# Patient Record
Sex: Female | Born: 1970
Health system: Southern US, Community
[De-identification: ages and names within clinical notes are randomized; demographics above are authoritative.]

## PROBLEM LIST (undated history)

## (undated) DIAGNOSIS — I10 Essential (primary) hypertension: Secondary | ICD-10-CM

## (undated) DIAGNOSIS — B029 Zoster without complications: Secondary | ICD-10-CM

## (undated) DIAGNOSIS — F32A Depression, unspecified: Secondary | ICD-10-CM

## (undated) DIAGNOSIS — F419 Anxiety disorder, unspecified: Secondary | ICD-10-CM

## (undated) DIAGNOSIS — F329 Major depressive disorder, single episode, unspecified: Secondary | ICD-10-CM

## (undated) HISTORY — DX: Anxiety disorder, unspecified: F41.9

## (undated) HISTORY — DX: Essential (primary) hypertension: I10

## (undated) HISTORY — DX: Major depressive disorder, single episode, unspecified: F32.9

## (undated) HISTORY — DX: Depression, unspecified: F32.A

---

## 1999-02-10 ENCOUNTER — Other Ambulatory Visit: Admission: RE | Admit: 1999-02-10 | Discharge: 1999-02-10 | Payer: Self-pay | Admitting: Obstetrics

## 1999-03-23 ENCOUNTER — Other Ambulatory Visit: Admission: RE | Admit: 1999-03-23 | Discharge: 1999-03-23 | Payer: Self-pay | Admitting: Obstetrics and Gynecology

## 2000-02-01 HISTORY — PX: TUBAL LIGATION: SHX77

## 2000-03-22 ENCOUNTER — Other Ambulatory Visit: Admission: RE | Admit: 2000-03-22 | Discharge: 2000-03-22 | Payer: Self-pay | Admitting: Obstetrics and Gynecology

## 2000-04-14 ENCOUNTER — Ambulatory Visit (HOSPITAL_COMMUNITY): Admission: AD | Admit: 2000-04-14 | Discharge: 2000-04-14 | Payer: Self-pay | Admitting: Obstetrics and Gynecology

## 2000-10-03 ENCOUNTER — Inpatient Hospital Stay (HOSPITAL_COMMUNITY): Admission: AD | Admit: 2000-10-03 | Discharge: 2000-10-03 | Payer: Self-pay | Admitting: Obstetrics and Gynecology

## 2000-10-05 ENCOUNTER — Inpatient Hospital Stay (HOSPITAL_COMMUNITY): Admission: AD | Admit: 2000-10-05 | Discharge: 2000-10-08 | Payer: Self-pay | Admitting: Obstetrics and Gynecology

## 2000-10-05 ENCOUNTER — Encounter (INDEPENDENT_AMBULATORY_CARE_PROVIDER_SITE_OTHER): Payer: Self-pay | Admitting: Specialist

## 2002-02-03 ENCOUNTER — Encounter: Payer: Self-pay | Admitting: Emergency Medicine

## 2002-02-03 ENCOUNTER — Emergency Department (HOSPITAL_COMMUNITY): Admission: EM | Admit: 2002-02-03 | Discharge: 2002-02-03 | Payer: Self-pay | Admitting: Emergency Medicine

## 2003-07-03 ENCOUNTER — Other Ambulatory Visit: Admission: RE | Admit: 2003-07-03 | Discharge: 2003-07-03 | Payer: Self-pay | Admitting: Family Medicine

## 2006-04-29 LAB — CONVERTED CEMR LAB: Pap Smear: NORMAL

## 2006-06-07 ENCOUNTER — Encounter (INDEPENDENT_AMBULATORY_CARE_PROVIDER_SITE_OTHER): Payer: Self-pay | Admitting: Family Medicine

## 2006-06-07 ENCOUNTER — Encounter: Payer: Self-pay | Admitting: Family Medicine

## 2006-06-07 ENCOUNTER — Ambulatory Visit: Payer: Self-pay | Admitting: Family Medicine

## 2006-06-07 DIAGNOSIS — F419 Anxiety disorder, unspecified: Secondary | ICD-10-CM | POA: Insufficient documentation

## 2006-06-07 DIAGNOSIS — I10 Essential (primary) hypertension: Secondary | ICD-10-CM | POA: Insufficient documentation

## 2006-06-07 LAB — CONVERTED CEMR LAB
BUN: 7 mg/dL (ref 6–23)
CO2: 30 meq/L (ref 19–32)
Calcium: 9.5 mg/dL (ref 8.4–10.5)
Chloride: 107 meq/L (ref 96–112)
Creatinine, Ser: 0.6 mg/dL (ref 0.4–1.2)
GFR calc Af Amer: 145 mL/min
GFR calc non Af Amer: 120 mL/min
Glucose, Bld: 91 mg/dL (ref 70–99)
Potassium: 4.2 meq/L (ref 3.5–5.1)
Sodium: 141 meq/L (ref 135–145)

## 2007-03-19 ENCOUNTER — Telehealth (INDEPENDENT_AMBULATORY_CARE_PROVIDER_SITE_OTHER): Payer: Self-pay | Admitting: *Deleted

## 2007-08-24 ENCOUNTER — Ambulatory Visit: Payer: Self-pay | Admitting: *Deleted

## 2007-08-24 DIAGNOSIS — R799 Abnormal finding of blood chemistry, unspecified: Secondary | ICD-10-CM | POA: Insufficient documentation

## 2007-09-17 ENCOUNTER — Encounter (INDEPENDENT_AMBULATORY_CARE_PROVIDER_SITE_OTHER): Payer: Self-pay | Admitting: *Deleted

## 2007-11-29 ENCOUNTER — Encounter (INDEPENDENT_AMBULATORY_CARE_PROVIDER_SITE_OTHER): Payer: Self-pay | Admitting: *Deleted

## 2007-11-29 ENCOUNTER — Ambulatory Visit: Payer: Self-pay | Admitting: Family Medicine

## 2007-11-29 DIAGNOSIS — J069 Acute upper respiratory infection, unspecified: Secondary | ICD-10-CM | POA: Insufficient documentation

## 2008-03-17 ENCOUNTER — Telehealth (INDEPENDENT_AMBULATORY_CARE_PROVIDER_SITE_OTHER): Payer: Self-pay | Admitting: *Deleted

## 2008-06-18 ENCOUNTER — Ambulatory Visit: Payer: Self-pay | Admitting: Family Medicine

## 2008-06-18 ENCOUNTER — Other Ambulatory Visit: Admission: RE | Admit: 2008-06-18 | Discharge: 2008-06-18 | Payer: Self-pay | Admitting: Family Medicine

## 2008-06-18 ENCOUNTER — Encounter: Payer: Self-pay | Admitting: Family Medicine

## 2008-06-19 ENCOUNTER — Encounter (INDEPENDENT_AMBULATORY_CARE_PROVIDER_SITE_OTHER): Payer: Self-pay | Admitting: *Deleted

## 2008-06-19 ENCOUNTER — Encounter: Payer: Self-pay | Admitting: Family Medicine

## 2008-06-19 LAB — CONVERTED CEMR LAB
ALT: 12 units/L (ref 0–35)
AST: 17 units/L (ref 0–37)
Albumin: 4.4 g/dL (ref 3.5–5.2)
Alkaline Phosphatase: 44 units/L (ref 39–117)
BUN: 11 mg/dL (ref 6–23)
Basophils Absolute: 0.1 10*3/uL (ref 0.0–0.1)
Basophils Relative: 1 % (ref 0.0–3.0)
Bilirubin, Direct: 0.2 mg/dL (ref 0.0–0.3)
CO2: 29 meq/L (ref 19–32)
Calcium: 9.4 mg/dL (ref 8.4–10.5)
Chloride: 108 meq/L (ref 96–112)
Cholesterol: 165 mg/dL (ref 0–200)
Creatinine, Ser: 0.7 mg/dL (ref 0.4–1.2)
Eosinophils Absolute: 0.3 10*3/uL (ref 0.0–0.7)
Eosinophils Relative: 4.7 % (ref 0.0–5.0)
GFR calc non Af Amer: 120.4 mL/min (ref >60)
Glucose, Bld: 87 mg/dL (ref 70–99)
HCT: 38.1 % (ref 36.0–46.0)
HDL: 67.1 mg/dL (ref >39.00)
Hemoglobin: 12.7 g/dL (ref 12.0–15.0)
LDL Cholesterol: 91 mg/dL (ref 0–99)
Lymphocytes Relative: 26.4 % (ref 12.0–46.0)
Lymphs Abs: 1.5 10*3/uL (ref 0.7–4.0)
MCHC: 33.4 g/dL (ref 30.0–36.0)
MCV: 82.2 fL (ref 78.0–100.0)
Monocytes Absolute: 0.5 10*3/uL (ref 0.1–1.0)
Monocytes Relative: 8.3 % (ref 3.0–12.0)
Neutro Abs: 3.2 10*3/uL (ref 1.4–7.7)
Neutrophils Relative %: 59.6 % (ref 43.0–77.0)
Platelets: 276 10*3/uL (ref 150.0–400.0)
Potassium: 3.9 meq/L (ref 3.5–5.1)
RBC: 4.63 M/uL (ref 3.87–5.11)
RDW: 13.6 % (ref 11.5–14.6)
Sodium: 141 meq/L (ref 135–145)
TSH: 1.44 microintl units/mL (ref 0.35–5.50)
Total Bilirubin: 1 mg/dL (ref 0.3–1.2)
Total CHOL/HDL Ratio: 2
Total Protein: 7.7 g/dL (ref 6.0–8.3)
Triglycerides: 34 mg/dL (ref 0.0–149.0)
VLDL: 6.8 mg/dL (ref 0.0–40.0)
WBC: 5.6 10*3/uL (ref 4.5–10.5)

## 2008-06-20 ENCOUNTER — Encounter (INDEPENDENT_AMBULATORY_CARE_PROVIDER_SITE_OTHER): Payer: Self-pay | Admitting: *Deleted

## 2008-06-20 LAB — CONVERTED CEMR LAB: Anti Nuclear Antibody(ANA): NEGATIVE

## 2008-09-14 ENCOUNTER — Emergency Department (HOSPITAL_COMMUNITY): Admission: EM | Admit: 2008-09-14 | Discharge: 2008-09-14 | Payer: Self-pay | Admitting: Emergency Medicine

## 2008-09-18 ENCOUNTER — Telehealth (INDEPENDENT_AMBULATORY_CARE_PROVIDER_SITE_OTHER): Payer: Self-pay | Admitting: *Deleted

## 2009-02-24 ENCOUNTER — Telehealth (INDEPENDENT_AMBULATORY_CARE_PROVIDER_SITE_OTHER): Payer: Self-pay | Admitting: *Deleted

## 2009-06-22 ENCOUNTER — Telehealth (INDEPENDENT_AMBULATORY_CARE_PROVIDER_SITE_OTHER): Payer: Self-pay | Admitting: *Deleted

## 2009-08-14 ENCOUNTER — Telehealth (INDEPENDENT_AMBULATORY_CARE_PROVIDER_SITE_OTHER): Payer: Self-pay | Admitting: *Deleted

## 2009-09-06 ENCOUNTER — Other Ambulatory Visit: Admission: RE | Admit: 2009-09-06 | Discharge: 2009-09-06 | Payer: Self-pay | Admitting: Family Medicine

## 2009-09-06 LAB — HM PAP SMEAR: HM Pap smear: NEGATIVE

## 2009-09-07 ENCOUNTER — Ambulatory Visit: Payer: Self-pay | Admitting: Family Medicine

## 2009-09-07 DIAGNOSIS — N920 Excessive and frequent menstruation with regular cycle: Secondary | ICD-10-CM | POA: Insufficient documentation

## 2009-09-07 LAB — CONVERTED CEMR LAB
Albumin: 4.1 g/dL (ref 3.5–5.2)
Alkaline Phosphatase: 40 units/L (ref 39–117)
BUN: 15 mg/dL (ref 6–23)
Basophils Absolute: 0.1 10*3/uL (ref 0.0–0.1)
CO2: 30 meq/L (ref 19–32)
Calcium: 9.2 mg/dL (ref 8.4–10.5)
Creatinine, Ser: 0.6 mg/dL (ref 0.4–1.2)
Eosinophils Absolute: 0.2 10*3/uL (ref 0.0–0.7)
Glucose, Bld: 95 mg/dL (ref 70–99)
HDL: 72.6 mg/dL (ref >39.00)
Hemoglobin: 12.4 g/dL (ref 12.0–15.0)
Lymphocytes Relative: 24.7 % (ref 12.0–46.0)
MCHC: 33.4 g/dL (ref 30.0–36.0)
Neutro Abs: 3.1 10*3/uL (ref 1.4–7.7)
Neutrophils Relative %: 59.3 % (ref 43.0–77.0)
RDW: 13.5 % (ref 11.5–14.6)
TSH: 1.67 microintl units/mL (ref 0.35–5.50)
Triglycerides: 56 mg/dL (ref 0.0–149.0)

## 2009-09-08 ENCOUNTER — Telehealth: Payer: Self-pay | Admitting: Family Medicine

## 2009-09-09 LAB — CONVERTED CEMR LAB: Pap Smear: NEGATIVE

## 2009-09-10 ENCOUNTER — Encounter: Admission: RE | Admit: 2009-09-10 | Discharge: 2009-09-10 | Payer: Self-pay | Admitting: Family Medicine

## 2009-09-10 ENCOUNTER — Telehealth: Payer: Self-pay | Admitting: Family Medicine

## 2009-10-07 ENCOUNTER — Telehealth: Payer: Self-pay | Admitting: Family Medicine

## 2009-10-08 ENCOUNTER — Ambulatory Visit: Payer: Self-pay | Admitting: Family Medicine

## 2009-10-19 ENCOUNTER — Telehealth: Payer: Self-pay | Admitting: Family Medicine

## 2010-03-04 NOTE — Assessment & Plan Note (Signed)
Summary: CPX AND FASTING LABS///SPH   Vital Signs:  Patient profile:   40 year old female Height:      64.50 inches Weight:      172 pounds BMI:     29.17 Pulse rate:   84 / minute BP sitting:   112 / 80  (left arm)  Vitals Entered By: Doristine Devoid CMA (September 07, 2009 8:08 AM) CC: CPX AND LABS W/ PAP   History of Present Illness: 40 yo woman here today for CPE.  no concerns today.  seeing bariatric clinic- on Phenteramine  Preventive Screening-Counseling & Management  Alcohol-Tobacco     Alcohol drinks/day: <1     Smoking Status: never  Caffeine-Diet-Exercise     Does Patient Exercise: no      Sexual History:  currently monogamous.        Drug Use:  never.    Current Medications (verified): 1)  Hydrochlorothiazide 25 Mg  Tabs (Hydrochlorothiazide) .... Take One Tablet Daily  Allergies (verified): 1)  ! Sulfa 2)  ! 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    Physical Exam  General:   Well-developed,well-nourished,in no acute distress; alert,appropriate and cooperative throughout examination Head:  Normocephalic and atraumatic without obvious abnormalities. No apparent alopecia or balding.  Eyes:  No corneal or conjunctival inflammation noted. EOMI. Perrla. Funduscopic exam benign, without hemorrhages, exudates or papilledema. Vision grossly normal. Ears:  External ear exam shows no significant lesions or deformities.  Otoscopic examination reveals clear canals, tympanic membranes are intact bilaterally without bulging, retraction, inflammation or discharge. Hearing is grossly normal bilaterally. Nose:  External nasal examination shows no deformity or inflammation. Nasal mucosa are pink and moist without lesions or exudates. Mouth:  Oral mucosa and oropharynx without lesions or exudates.  Teeth in good repair. Neck:  No deformities, masses, or tenderness noted. Breasts:  No mass, nodules, thickening, tenderness, bulging, retraction, inflamation, nipple discharge or skin changes noted.   Lungs:  Normal respiratory effort, chest expands symmetrically. Lungs are clear to auscultation, no crackles or wheezes. Heart:  Normal rate and regular rhythm. S1 and S2 normal without gallop, murmur, click, rub or other extra sounds. Abdomen:  Bowel sounds positive,abdomen soft and non-tender without masses, organomegaly or hernias noted.  ? palpable uterus Genitalia:  Pelvic Exam:        External: normal female genitalia without lesions or masses        Vagina: normal without lesions or masses        Cervix: normal without lesions or masses        Adnexa: normal bimanual exam without masses or fullness        Uterus: feels mildly enlarged  Pap smear: performed Msk:  No deformity or scoliosis noted of thoracic or lumbar spine.   Pulses:  +2 carotid, DP, radial Extremities:  No clubbing, cyanosis, edema, or deformity noted with normal full range of motion of all joints.   Neurologic:   No cranial nerve deficits noted. Station and gait are normal. Plantar reflexes are down-going bilaterally. DTRs are symmetrical throughout. Sensory, motor and coordinative functions appear intact. Skin:  Intact without suspicious lesions or rashes Cervical Nodes:  No lymphadenopathy noted Axillary Nodes:  No palpable lymphadenopathy Psych:  Cognition and judgment appear intact. Alert and cooperative with normal attention span and concentration. No apparent delusions, illusions, hallucinations   Impression & Recommendations:  Problem # 1:  HEALTHY ADULT FEMALE (ICD-V70.0) Assessment Unchanged pt's PE WNL w/ exception of possible fibroids.  check labs.  encouraged regular diet and exercise.  Orders: Venipuncture (81191) TLB-Lipid Panel (80061-LIPID) TLB-BMP (Basic Metabolic Panel-BMET) (80048-METABOL) TLB-CBC Platelet - w/Differential (85025-CBCD) TLB-Hepatic/Liver Function Pnl (80076-HEPATIC) TLB-TSH (Thyroid Stimulating Hormone) (84443-TSH) T-Vitamin D (25-Hydroxy) (47829-56213)  Problem # 2:  SCREENING FOR MALIGNANT NEOPLASM OF THE CERVIX (ICD-V76.2) Assessment: Unchanged pap collected  Problem # 3:  EXCESSIVE OR FREQUENT MENSTRUATION (ICD-626.2) Assessment: New when questioned about possibility of fibroids pt admits to heavy menstrual bleeding on the first 2 days of her cycle.  'lots of clots'.  will get [place] to determine if fibroids present given mild enlargement of uterus.  Pt expresses understanding and is in agreement w/ this plan. Orders: Radiology Referral (Radiology)  Complete Medication List: 1)  Hydrochlorothiazide 25 Mg Tabs (Hydrochlorothiazide) .... Take one tablet daily  Patient Instructions: 1)  Follow up in 1 year or as needed 2)  We'll notify you of your lab results 3)  Someone will call you with your pelvic ultrasound 4)  Try and get back into your regular exercise 5)  Call with any questions or concerns 6)  Enjoy back to school!!!

## 2010-03-04 NOTE — Progress Notes (Signed)
Summary: [facility] -call-a-nurse  Phone Note Outgoing Call   Details for Reason: [facility] Triage Call Report Triage Record Num: [id number] Operator: Craig Guess Patient Name: [facility] Call Date & Time: 10/17/2009 11:02:51AM Patient Phone: [phone] PCP: Eugenio Hoes. Todd Patient Gender: Female PCP Fax : [phone] Patient DOB: 1970-03-17 Practice Name: [facility] Reason for Call: Patient is calling with Flu-like sx and wants to know what's safe to take with Celexa & HCTZ. Caller reports aching and chills beginning yesterday. A little bit of dizziness and nausea today. Caller asking if she can take Tylenol with her other meds. Caller advised Tylenol is safe to take. Advised to rest and increase fluids. She voices understanding. Protocol(s) Used: Medication Question Calls, No Triage (Adults) Recommended Outcome per Protocol: Provide Information or Advice Only Reason for Outcome: Caller has medication question only and triager answers question Care Advice:  ~ 09/ Summary of Call: left message to call office ...............Marland KitchenFelecia Deloach CMA  October 19, 2009 8:28 AM  left message to call office ................Marland KitchenFelecia Deloach CMA  October 19, 2009 4:48 PM  left message to call office ....................Marland KitchenFelecia Deloach CMA  October 20, 2009 8:57 AM    Pt states that she is feeling much better now, advise pt if symptoms return to give [place] a call. Pt ok verbalized understanding ..................Marland KitchenFelecia Deloach CMA  October 21, 2009 10:42 AM   Follow-up for Phone Call        noted Follow-up by: Neena Rhymes MD,  October 21, 2009 11:04 AM

## 2010-03-04 NOTE — Assessment & Plan Note (Signed)
Summary: discuss anxiety//lch   Vital Signs:  Patient profile:   40 year old female Weight:      171 pounds Pulse rate:   106 / minute BP sitting:   124 / 76  (left arm)  Vitals Entered By: Doristine Devoid CMA (October 08, 2009 1:47 PM) CC: discuss anxiety not getting enough sleep and increase stress    History of Present Illness: 40 yo woman here today for anxiety/depression.  not sleeping.  increased stress at home since school started.  problems w/ husband.  crying frequently.  often snapping at the kids.  previously on Zoloft- had diarrhea, low sex drive, fatigue.  pt has set up counseling.  exercising regularly- good stress outlet.  would like to re-start meds.  Current Medications (verified): 1)  Hydrochlorothiazide 25 Mg  Tabs (Hydrochlorothiazide) .... Take One Tablet Daily 2)  Vitamin D (Ergocalciferol) 50000 Unit Caps (Ergocalciferol) .... Take 1 Capsule A Week For 12 Weeks.  Allergies (verified): 1)  ! Sulfa 2)  ! 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 Alert and cooperative with normal attention span and concentration. No apparent delusions, illusions, hallucinations   Impression & Recommendations:  Problem # 1:  DEPRESSION (ICD-311) Assessment Deteriorated Pt having recurrence due to marital stress.  start SSRI.  applauded her decision for counseling.  will follow closely. Her updated medication list for this problem includes:    Citalopram Hydrobromide 20 Mg Tabs (Citalopram hydrobromide) .Marland Kitchen... Take one tablet by mouth daily  Complete Medication  List: 1)  Hydrochlorothiazide 25 Mg Tabs (Hydrochlorothiazide) .... Take one tablet daily 2)  Vitamin D (ergocalciferol) 50000 Unit Caps (Ergocalciferol) .... Take 1 capsule a week for 12 weeks. 3)  Citalopram Hydrobromide 20 Mg Tabs (Citalopram hydrobromide) .... Take one tablet by mouth daily  Patient Instructions: 1)  Please schedule a follow-up appointment in 1 month to follow up depression. 2)  If you have side effects- call me! 3)  I'm proud of you for starting counseling 4)  Take the Citalopram (Celexa) daily- if you have fatigue, take it at night 5)  Hang in there!! Prescriptions: CITALOPRAM HYDROBROMIDE 20 MG TABS (CITALOPRAM HYDROBROMIDE) take one tablet by mouth daily  #30 x 3   Entered and Authorized by:   Neena Rhymes MD   Signed by:   Neena Rhymes MD on 10/08/2009   Method used:   Electronically to        [facility] [phone]* (retail)       [address]       [place], [place]  [address]       Ph: [phone]       Fax: [phone]   RxID:   [id number]

## 2010-03-04 NOTE — Progress Notes (Signed)
Summary: cpx scheduled [id number]  Phone Note Outgoing Call   Summary of Call: DUE CPX WITH DR Cammy Copa [facility]  Jun 22, 2009 9:40 AM   Follow-up for Phone Call        [email or web address] Barban  Jun 22, 2009 9:43 AM  Additional Follow-up for Phone Call Additional follow up Details #1::        patient retd call scheduled cpx [id number] Additional Follow-up by: Okey Regal [place],  Jun 22, 2009 4:03 PM

## 2010-03-04 NOTE — Progress Notes (Signed)
  Phone Note Call from Patient   Caller: Patient Summary of Call: pt c/o achy chest pain dull ache middle, recommend pt to ED today, pt agreed. pt was due for followup from May ov scheduled with NP Initial call taken by: Kandice Hams,  February 24, 2009 12:14 PM

## 2010-03-04 NOTE — Progress Notes (Signed)
Summary: hair loss  Phone Note Call from Patient   Caller: Patient Call For: Neena Rhymes MD Summary of Call: Pt states she has been having hair loss x years (since she had her children) and seems to be getting worse. Wants to know if low vitamin D can be causing this? Pt is also taking Phentermine 30mg  once daily from Bariatric clinic. Please advise. Nicki Guadalajara Fergerson CMA Duncan Dull)  September 10, 2009 10:32 AM   Follow-up for Phone Call        according to multiple articles there may be a link between vit D deficieny and hair loss.  she should take the replacement therapy as directed and see if sxs improve. Follow-up by: Neena Rhymes MD,  September 10, 2009 12:47 PM  Additional Follow-up for Phone Call Additional follow up Details #1::        Pt notified. Nicki Guadalajara Fergerson CMA (AAMA)  September 10, 2009 1:31 PM     New/Updated Medications: PHENTERMINE HCL 30 MG CAPS (PHENTERMINE HCL) Take 1 tablet by mouth once a day

## 2010-03-04 NOTE — Progress Notes (Signed)
Summary: labs results  Phone Note Outgoing Call   Call placed by: Doristine Devoid CMA,  September 08, 2009 4:04 PM Call placed to: Patient Summary of Call: level is low.  needs to start 50,000 units weekly x12 weeks and then recheck level   labs look great!  keep up the good work!  Follow-up for Phone Call        left message on machine .........Marland KitchenDoristine Devoid CMA  September 08, 2009 4:05 PM   Left detailed message re:  Dr Rennis Golden instructions on cell phone and to call if has any questions. Rx sent to pharmacy. Nicki Guadalajara Fergerson CMA Duncan Dull)  September 10, 2009 9:37 AM     New/Updated Medications: VITAMIN D (ERGOCALCIFEROL) 50000 UNIT CAPS (ERGOCALCIFEROL) Take 1 capsule a week for 12 weeks. Prescriptions: VITAMIN D (ERGOCALCIFEROL) 50000 UNIT CAPS (ERGOCALCIFEROL) Take 1 capsule a week for 12 weeks.  #4 x 2   Entered by:   Mervin Kung CMA (AAMA)   Authorized by:   Neena Rhymes MD   Signed by:   Mervin Kung CMA (AAMA) on 09/10/2009   Method used:   Electronically to        [facility] [phone]* (retail)       [address]       [place], [place]  [address]       Ph: [phone]       Fax: [phone]   RxID:   [id number]

## 2010-03-04 NOTE — Progress Notes (Signed)
Summary: NEEDS REFILL, HAS CPX APPT SCHEDULED  Phone Note Call from Patient Call back at [phone]   Caller: Patient Summary of Call: PATIENT HAS CPX AND FASTING LABS SCHEDULED FOR 09/07/2009 AT 8AM  SHE NEEDS 30 DAY REFILL FOR HYDROCHLORATHYAZIDE CALLED IN TO [facility] ON [address]  I DID INFORM PATIENT THAT, IF THE DOCTOR APPROVED THE REFILL, THAT SHE NEEDS TO KEEP THE CPX APPT IN ORDER TO GET ANY MORE REFILLS   Initial call taken by: Jerolyn Shin,  August 14, 2009 10:48 AM    Prescriptions: HYDROCHLOROTHIAZIDE 25 MG  TABS (HYDROCHLOROTHIAZIDE) Take one tablet daily - DUE COMPLETE PHYSICAL  #30 x 0   Entered by:   Doristine Devoid CMA   Authorized by:   Neena Rhymes MD   Signed by:   Doristine Devoid CMA on 08/14/2009   Method used:   Electronically to        [facility] [phone]* (retail)       [address]       [place], [place]  [address]       Ph: [phone]       Fax: [phone]   RxID:   [id number]

## 2010-03-04 NOTE — Progress Notes (Signed)
Summary: Anxiety Med  Phone Note Call from Patient Call back at Home Phone [phone] Call back at [phone]   Caller: Patient Summary of Call: Patient called and LM on triage VM stating that during her last OV she had brought up the option of maybe starting some anti-anxiety medicines. Upon looking back at last OV there is nothing documented about this. Will call patient back and offer an appt to discuss this.   Initial call taken by: Harold Barban,  October 07, 2009 12:47 PM  Follow-up for Phone Call        Spoke with patient and informed her that any time a patient wants to start a new medicine for anxiety, depression, etc we need to see them in the office before hand. I offered her an appt today or tomorrow. She took the appt tomorrow afternoon.  Follow-up by: Harold Barban,  October 07, 2009 12:51 PM  Additional Follow-up for Phone Call Additional follow up Details #1::        noted. Additional Follow-up by: Neena Rhymes MD,  October 07, 2009 12:55 PM

## 2010-03-15 ENCOUNTER — Other Ambulatory Visit: Payer: Self-pay | Admitting: Family Medicine

## 2010-03-15 ENCOUNTER — Ambulatory Visit (INDEPENDENT_AMBULATORY_CARE_PROVIDER_SITE_OTHER): Payer: 59 | Admitting: Family Medicine

## 2010-03-15 ENCOUNTER — Encounter: Payer: Self-pay | Admitting: Family Medicine

## 2010-03-15 DIAGNOSIS — R109 Unspecified abdominal pain: Secondary | ICD-10-CM | POA: Insufficient documentation

## 2010-03-15 DIAGNOSIS — R209 Unspecified disturbances of skin sensation: Secondary | ICD-10-CM | POA: Insufficient documentation

## 2010-03-15 LAB — BASIC METABOLIC PANEL
CO2: 27 mEq/L (ref 19–32)
Calcium: 9 mg/dL (ref 8.4–10.5)
GFR: 160.97 mL/min (ref >60.00)
Sodium: 137 mEq/L (ref 135–145)

## 2010-03-15 LAB — CBC WITH DIFFERENTIAL/PLATELET
Basophils Relative: 0.9 % (ref 0.0–3.0)
Eosinophils Relative: 3.7 % (ref 0.0–5.0)
Hemoglobin: 12.6 g/dL (ref 12.0–15.0)
Lymphocytes Relative: 26.7 % (ref 12.0–46.0)
Monocytes Relative: 9.8 % (ref 3.0–12.0)
Neutro Abs: 3.7 10*3/uL (ref 1.4–7.7)
RBC: 4.54 Mil/uL (ref 3.87–5.11)
WBC: 6.3 10*3/uL (ref 4.5–10.5)

## 2010-03-15 LAB — HEPATIC FUNCTION PANEL
AST: 19 U/L (ref 0–37)
Albumin: 4.2 g/dL (ref 3.5–5.2)
Alkaline Phosphatase: 46 U/L (ref 39–117)
Total Protein: 7.1 g/dL (ref 6.0–8.3)

## 2010-03-24 ENCOUNTER — Ambulatory Visit: Payer: Self-pay | Admitting: Family Medicine

## 2010-03-24 NOTE — Assessment & Plan Note (Signed)
Summary: sensation side of face,leg/chills/cbs   Vital Signs:  Patient profile:   40 year old female Weight:      183 pounds BMI:     31.04 Pulse rate:   72 / minute BP sitting:   116 / 78  (left arm)  Vitals Entered By: Doristine Devoid CMA (March 15, 2010 9:52 AM) CC: painful sensation on R side of body similar to when pt had shingles also stomach bloating from fibroids    History of Present Illness: 40 yo woman here today for   1) 'electrical shocks'- whole R side of body.  will periodically have sxs on L.  will occasionally wake pt from sleep.  'burning sensation'.  'like i'm getting the shingles w/out the rash'.  will occur 5-6x/day.  resolves spontaneously, nothing makes sxs better or worse.  sxs started 3 weeks ago.  no change in activity or stress- using Celexa as needed.  2) fibroids- having heavier periods, abd distension.  GYN- Haygood.  clothes are fitting differently.  no N/V/D, no change in appetite.    Current Medications (verified): 1)  Hydrochlorothiazide 25 Mg  Tabs (Hydrochlorothiazide) .... Take One Tablet Daily 2)  Vitamin D (Ergocalciferol) 50000 Unit Caps (Ergocalciferol) .... Take 1 Capsule A Week For 12 Weeks. 3)  Citalopram Hydrobromide 20 Mg Tabs (Citalopram Hydrobromide) .... Take One Tablet By Mouth Daily  Allergies (verified): 1)  ! Sulfa 2)  ! Penicillin  Review of Systems      See HPI  Physical Exam  General:  Well-developed,well-nourished,in no acute distress; alert,appropriate and cooperative throughout examination Head:  Normocephalic and atraumatic without obvious abnormalities. No apparent alopecia or balding.  Neck:  No deformities, masses, or tenderness noted. Lungs:  Normal respiratory effort, chest expands symmetrically. Lungs are clear to auscultation, no crackles or wheezes. Heart:  Normal rate and regular rhythm. S1 and S2 normal without gallop, murmur, click, rub or other extra sounds. Abdomen:  Bowel sounds positive,abdomen soft  and non-tender without masses, organomegaly or hernias noted.  palpable uterus below umbilicus Msk:  + trap spasm on R good flexion and extension of back Pulses:  +2 carotid, DP, radial Extremities:  No clubbing, cyanosis, edema, or deformity noted with normal full range of motion of all joints.   Neurologic:  alert & oriented X3, cranial nerves II-XII intact, strength normal in all extremities, sensation intact to light touch, gait normal, and DTRs symmetrical and normal.   Skin:  turgor normal, color normal, and no rashes.   Cervical Nodes:  No lymphadenopathy noted Psych:  Cognition and judgment appear intact. Alert and cooperative with normal attention span and concentration. No apparent delusions, illusions, hallucinations   Impression & Recommendations:  Problem # 1:  PARESTHESIA (ICD-782.0) Assessment New ? whether pt's muscle spasm is causing neuropathic pain.  no evidence of shingles.  reviewed that this would be highly unlikely given that sxs cross multiple dermatomal borders.  if no improvement w/ ibuprofen and muscle relaxers will refer to neuro for nerve conduction study.  PE normal today in office.  Problem # 2:  ABDOMINAL PAIN (ICD-789.00) Assessment: New pt's pain and bloating most likely related to enlarging fibroids.  check labs to r/o infxn or electrolyte abnormality.  encouraged pt to call GYN. Orders: Venipuncture ([address]) TLB-BMP (Basic Metabolic Panel-BMET) (80048-METABOL) TLB-CBC Platelet - w/Differential (85025-CBCD) TLB-Hepatic/Liver Function Pnl (80076-HEPATIC) Specimen Handling ([address])  Complete Medication List: 1)  Hydrochlorothiazide 25 Mg Tabs (Hydrochlorothiazide) .... Take one tablet daily 2)  Vitamin D (ergocalciferol) 50000  Unit Caps (Ergocalciferol) .... Take 1 capsule a week for 12 weeks. 3)  Citalopram Hydrobromide 20 Mg Tabs (Citalopram hydrobromide) .... Take one tablet by mouth daily 4)  Naprosyn 500 Mg Tabs (Naproxen) .Marland Kitchen.. 1 two times a day  x7-10 days and then as needed.  take w/ food. 5)  Cyclobenzaprine Hcl 10 Mg Tabs (Cyclobenzaprine hcl) .Marland Kitchen.. 1 by mouth nightly for muscle spasm  Patient Instructions: 1)  Call me in 3 weeks and let me know how your electric shocks are feeling- if no better will refer you for a nerve conduction test 2)  Take the Naprosyn as directed for muscle inflammation 3)  Use the cyclobenzaprine (muscle relaxer) at night 4)  Call Dr Haygood/Stringer to set up appt to discuss your fibroids 5)  Call with any questions or concerns 6)  Hang in there!!! Prescriptions: CYCLOBENZAPRINE HCL 10 MG  TABS (CYCLOBENZAPRINE HCL) 1 by mouth nightly for muscle spasm  #20 x 0   Entered and Authorized by:   Neena Rhymes MD   Signed by:   Neena Rhymes MD on 03/15/2010   Method used:   Electronically to        [facility] [address] #[id number]* (retail)       [address]       [place], [place]  [address]       Ph: [phone]       Fax: [phone]   RxID:   [id number] NAPROSYN 500 MG TABS (NAPROXEN) 1 two times a day x7-10 days and then as needed.  take w/ food.  #60 x 1   Entered and Authorized by:   Neena Rhymes MD   Signed by:   Neena Rhymes MD on 03/15/2010   Method used:   Electronically to        [facility] [address] [phone]* (retail)       [address]       [place], [place]  [address]       Ph: [phone]       Fax: [phone]   RxID:   [id number]    Orders Added: 1)  Venipuncture [37628] 2)  TLB-BMP (Basic Metabolic Panel-BMET) [80048-METABOL] 3)  TLB-CBC Platelet - w/Differential [85025-CBCD] 4)  TLB-Hepatic/Liver Function Pnl [80076-HEPATIC] 5)  Specimen Handling [99000] 6)  Est. Patient Level III [31517]

## 2010-05-09 LAB — URINALYSIS, ROUTINE W REFLEX MICROSCOPIC
Bilirubin Urine: NEGATIVE
Ketones, ur: 15 mg/dL — AB
Nitrite: NEGATIVE
Urobilinogen, UA: 0.2 mg/dL (ref 0.0–1.0)

## 2010-06-18 NOTE — Op Note (Signed)
[facility]  Patient:    Angela Morris, Angela Morris Visit Number: [id number] MRN: [id number]          Service Type: Attending:  Janine Limbo, M.D. Dictated by:   Janine Limbo, M.D. Proc. Date: 10/06/00                             Operative Report  PREOPERATIVE DIAGNOSES:       1. Postpartum day #0.                               2. Desires sterilization.  POSTOPERATIVE DIAGNOSES:      1. Postpartum day #0.                               2. Desires sterilization.                               3. Fibroid uterus.  OPERATION:                    Modified Pomeroy bilateral tubal                               ligation.  SURGEON:                      Janine Limbo, M.D.  ANESTHESIA:                   Epidural.  DISPOSITION:                  The patient is a 40 year old female, now para 2-0-0-2, who had a vaginal delivery of a healthy female infant this morning. She desires permanent sterilization.  She understands the indications for her procedure and she accepts the risk of, but not limited to, anesthetic complications, bleeding, infection, and possible damage to the surrounding organs.  She understands that there is a small but real failure rate associated with this procedure (10-17 per 1000).  FINDINGS:                     The fallopian tubes were normal.  The ovaries appeared normal.  There was a 2 cm fibroid present on the left fundus of the uterus.  DESCRIPTION OF PROCEDURE:     The patient was taken to the operating room where it was determined that the epidural she had received for labor would be adequate for tubal ligation.  The patients abdomen was prepped with multiple layers of Betadine and then sterilely draped.  The subumbilical area was injected with 10 cc of 0.5% Marcaine.  A subumbilical incision was made and carried sharply through the subcutaneous tissue, the fascia and the anterior peritoneum.  There was bleeding from the  anterior peritoneum and this was controlled using a free tie of 0 plain catgut.  The left fallopian tube was identified and followed to its fimbriated end.  A knuckle of tube was made on the left using a free tie and then a suture ligature of 0 plain catgut.  The knuckle of tube thus made was excised.  Hemostasis was adequate.  An identical procedure was carried out on the  opposite side.  Again, hemostasis was adequate.  All instruments were removed.  The anterior peritoneum and the fascia were closed using a running suture of 2-0 Vicryl.  The skin was reapproximated using a 4-0 Vicryl.  Sponge, needle and instrument counts were correct on two occasions.  The estimated blood loss was 30 cc.  The patient tolerated the procedure well.  She was taken to the recovery room in stable condition. Dictated by:   Janine Limbo, M.D. Attending:  Janine Limbo, M.D. DD:  10/06/00 TD:  10/07/00 Job: [id number]

## 2010-06-18 NOTE — H&P (Signed)
[facility]  Patient:    Angela Morris, Angela Morris Visit Number: [id number] MRN: [id number]          Service Type: OBS Location: 910B 9161 01 Attending Physician:  Shaune Spittle Dictated by:   Saverio Danker, C.N.M. Admit Date:  10/05/2000                           History and Physical  HISTORY OF PRESENT ILLNESS:   [name] is a 40 year old married black female, gravida 2, para 1-0-0-1, at 38-3/7 weeks who presents for induction of labor secondary to an elevated blood pressure in the office earlier today. She had a clean-catch urine at that time that had 1+ protein.  She also, per Dr. Orvan July report, had an ultrasound today that had a normal AFI and a 3200 gram fetus.  The patient denies headache, nausea, vomiting, or visual disturbances.  This pregnancy has been followed at [facility] by the M.D. service and has been essentially uncomplicated until this recent episode of elevated blood pressures and positive group B strep.  She is also allergic to penicillin.  ALLERGIES:                    PENICILLIN and SULFA.  OBSTETRICAL HISTORY:          Gravida 2, para 1-0-0-1, who delivered a viable female infant who weighed 7 pounds 12 ounces at [redacted] weeks gestation vaginally. She was delivered by Dr. Gaynell Face without complications.  GYNECOLOGICAL HISTORY:        She has no other significant GYN history.  PAST MEDICAL HISTORY:         She reports having the usual childhood diseases. She reports a history of varicose veins, occasional urinary tract infections, and pyelonephritis one time.  Her only hospitalization was for childbirth.  PAST SURGICAL HISTORY:        Her only surgery was on her teeth in 1998.  FAMILY HISTORY:               Maternal grandfather with type 2 diabetes, and mother with hypertension.  GENETIC HISTORY:              Negative.  SOCIAL HISTORY:               She is married to [employer], who is involved  and supportive.  They are both employed full-time.  They are of the [place] faith.  They deny any illicit drug use, alcohol, or smoking with this pregnancy.  PRENATAL LABORATORY DATA:     Blood type O positive.  Antibody screen negative.  Sickle cell trait is negative.  Syphilis is nonreactive.  Rubella is immune.  Hepatitis B surface antigen is negative.  Her one-hour Glucola was within normal range.  A 36-week beta strep was positive.  Her urine from today showed 1+ protein on a clean-catch specimen.  Other laboratories are currently pending.  A catheterized UA is currently pending.  PHYSICAL EXAMINATION:  VITAL SIGNS:                  Stable.  She is afebrile.  HEENT:                        Grossly within normal limits.  HEART:  Regular rhythm and rate.  CHEST:                        Clear.  BREASTS:                      Soft and nontender.  ABDOMEN:                      Gravid, with uterine contractions that are irregular and mild, about every 10-15 minutes.  Fetal heart rate is overall reactive and reassuring, with occasional variables with uterine contractions noted.  PELVIC:                       Her cervix is 1-2 cm, thick, vertex, -3 per Guadalupe Maple, R.N.  EXTREMITIES:                  Within normal limits.  ASSESSMENT:                   1. Intrauterine pregnancy at term.                               2. Positive group B strep.                               3. Elevated blood pressure in the office today                                  and 1+ protein on a clean-catch specimen.  PLAN:                         Admit to labor and delivery for induction per Dr. Normand Sloop.  To obtain a catheterized UA and [facility] laboratories.  To give her clindamycin 900 mg IV q.8h. for group B strep. Dictated by:   Vance Gather Duplantis, C.N.M. Attending Physician:  Shaune Spittle DD:  10/05/00 TD:  10/05/00 Job: [id number]

## 2010-06-18 NOTE — Discharge Summary (Signed)
[facility]  Patient:    Angela Morris, Angela Morris Visit Number: [id number] MRN: [id number]          Service Type: OBS Location: 910A 9113 01 Attending Physician:  Hal Morales Md Dictated by:   Mack Guise, C.N.M. Admit Date:  10/05/2000 Discharge Date: 10/08/2000                             Discharge Summary  ADMITTING DIAGNOSES:          1. Term pregnancy.                               2. Mild preeclampsia.                               3. Desires sterilization.  PROCEDURE:                    1. Normal spontaneous vaginal delivery.                               2. Repair of second-degree laceration.                               3. Bilateral tubal sterilization.  DISCHARGE DIAGNOSES:          1. Term pregnancy.                               2. Mild preeclampsia.                               3. Normal spontaneous vaginal delivery.                               4. Repair of second-degree laceration.                               5. Second stage bradycardia.                               6. Bilateral tubal sterilization.  HOSPITAL COURSE:              [name] is a 40 year old married, African-American female, gravida 2, para 1-0-0-1 at 38-3/7 weeks who presented for induction of labor secondary to mild preeclampsia. She was treated with clindamycin for positive group B strep. She entered into active labor with Cytotec for cervical ripening and Pitocin for induction of labor and went on to have a normal spontaneous vaginal delivery with the birth of a 6-pound 12-ounce female infant, Kayla, with Apgar scores of 8 at one minute and 9 at five minutes. The patient has done well in the postpartum period. Her hemoglobin on the first postpartum day was 9.8. On the first postpartum day the patient underwent a bilateral tubal sterilization by Dr. Marline Backbone. She has done well in the postpartum and postoperative period. Her vitals have been  stable and she is deemed  ins satisfactory condition for discharge on her second postpartum postoperative day.  DISCHARGE INSTRUCTIONS:       Postpartum instructions are per [facility] handout.  DISCHARGE MEDICATIONS:        1. Motrin 600 mg p.o. q.6h.                               2. Tylox one to two p.o. q.3-4h.                               3. Prenatal vitamins.  DISCHARGE FOLLOWUP:           The patient will follow up at the office of [facility] in six weeks. Dictated by:   Mack Guise, C.N.M. Attending Physician:  Hal Morales Md DD:  10/08/00 TD:  10/08/00 Job: [id number]

## 2010-06-21 ENCOUNTER — Other Ambulatory Visit: Payer: Self-pay | Admitting: Family Medicine

## 2010-06-23 ENCOUNTER — Encounter: Payer: Self-pay | Admitting: Family Medicine

## 2010-06-24 ENCOUNTER — Ambulatory Visit: Payer: 59 | Admitting: Family Medicine

## 2010-07-06 ENCOUNTER — Ambulatory Visit (INDEPENDENT_AMBULATORY_CARE_PROVIDER_SITE_OTHER): Payer: 59 | Admitting: Family Medicine

## 2010-07-06 VITALS — BP 110/74 | Temp 99.0°F | Wt 181.4 lb

## 2010-07-06 DIAGNOSIS — M79609 Pain in unspecified limb: Secondary | ICD-10-CM

## 2010-07-06 DIAGNOSIS — M79606 Pain in leg, unspecified: Secondary | ICD-10-CM

## 2010-07-06 LAB — BASIC METABOLIC PANEL
Chloride: 105 mEq/L (ref 96–112)
GFR: 137.03 mL/min (ref >60.00)
Potassium: 4.2 mEq/L (ref 3.5–5.1)
Sodium: 139 mEq/L (ref 135–145)

## 2010-07-06 NOTE — Assessment & Plan Note (Signed)
Pt currently asymptomatic.  'electric shocks' down the back of L leg consistent w/ sciatic nerve irritation, possibly due to muscle spasm.  Will refer to sports med for evaluation and tx.  Pt fears MS but agrees this is unlikely since sxs only seem to relate to exercise.  Will also check electrolytes to r/o dehydration or hypokalemia.  Reviewed supportive care and red flags that should prompt return.  Pt expressed understanding and is in agreement w/ plan.

## 2010-07-06 NOTE — Patient Instructions (Signed)
This appears to be muscle spasm- could be due to dehydration or low potassium Make sure you are drinking plenty of fluids Increase your potassium intake in the form of citrus fruits, bananas, dried fruits, veggies, beans, etc We'll notify you of your lab results and sports med appt If symptoms continue or worsen, call me and we'll refer you to neuro to rule out MS Call with any questions or concerns Hang in there!!!

## 2010-07-06 NOTE — Progress Notes (Signed)
  Subjective:    Patient ID: Angela Morris, female    DOB: Mar 23, 1970, 40 y.o.   MRN: [id number]  HPI Leg pain- L sided, was exercising on elliptical machine and then 3-4 days later had pain in glute radiating to hamstring.  'it was unbearable'.  Used heat, pain meds w/ some relief.  Friday had involuntary spasm of toes causing them to splay w/ numbness.  EMS arrived, checked BP and sxs resolved after 10 minutes.  Pt describes the pain as 'electrical shock'.  Pt currently asymptomatic.  Pain only seems to occur in relation to exercise.     Review of Systems For ROS see HPI     Objective:   Physical Exam  Constitutional: She appears well-developed and well-nourished. No distress.  Musculoskeletal: Normal range of motion. She exhibits no edema and no tenderness.       Pt w/ good flexion and extension of back, (-) SLR bilaterally.  Neurological: She has normal reflexes.       Strength and sensation symmetric and normal          Assessment & Plan:

## 2010-07-07 ENCOUNTER — Encounter: Payer: Self-pay | Admitting: *Deleted

## 2010-07-09 ENCOUNTER — Ambulatory Visit: Payer: 59 | Admitting: Family Medicine

## 2010-08-12 ENCOUNTER — Emergency Department (HOSPITAL_COMMUNITY)
Admission: EM | Admit: 2010-08-12 | Discharge: 2010-08-13 | Disposition: A | Discharge status: Home / Self Care | Payer: 59 | Attending: Emergency Medicine | Admitting: Emergency Medicine

## 2010-08-12 ENCOUNTER — Emergency Department (HOSPITAL_COMMUNITY): Payer: 59

## 2010-08-12 DIAGNOSIS — IMO0001 Reserved for inherently not codable concepts without codable children: Secondary | ICD-10-CM | POA: Insufficient documentation

## 2010-08-12 DIAGNOSIS — R0602 Shortness of breath: Secondary | ICD-10-CM | POA: Insufficient documentation

## 2010-08-12 DIAGNOSIS — I10 Essential (primary) hypertension: Secondary | ICD-10-CM | POA: Insufficient documentation

## 2010-08-12 DIAGNOSIS — B9789 Other viral agents as the cause of diseases classified elsewhere: Secondary | ICD-10-CM | POA: Insufficient documentation

## 2010-08-12 DIAGNOSIS — R059 Cough, unspecified: Secondary | ICD-10-CM | POA: Insufficient documentation

## 2010-08-12 DIAGNOSIS — R05 Cough: Secondary | ICD-10-CM | POA: Insufficient documentation

## 2010-08-12 DIAGNOSIS — R079 Chest pain, unspecified: Secondary | ICD-10-CM | POA: Insufficient documentation

## 2010-08-12 LAB — DIFFERENTIAL
Basophils Absolute: 0 10*3/uL (ref 0.0–0.1)
Basophils Relative: 1 % (ref 0–1)
Monocytes Absolute: 0.8 10*3/uL (ref 0.1–1.0)
Neutro Abs: 3.8 10*3/uL (ref 1.7–7.7)
Neutrophils Relative %: 58 % (ref 43–77)

## 2010-08-12 LAB — CBC
Hemoglobin: 12.1 g/dL (ref 12.0–15.0)
MCHC: 32.4 g/dL (ref 30.0–36.0)
Platelets: 290 10*3/uL (ref 150–400)

## 2010-08-13 LAB — URINALYSIS, ROUTINE W REFLEX MICROSCOPIC
Ketones, ur: NEGATIVE mg/dL
Leukocytes, UA: NEGATIVE
Nitrite: NEGATIVE
Protein, ur: NEGATIVE mg/dL
Urobilinogen, UA: 0.2 mg/dL (ref 0.0–1.0)
pH: 7 (ref 5.0–8.0)

## 2010-08-13 LAB — BASIC METABOLIC PANEL
Calcium: 9.5 mg/dL (ref 8.4–10.5)
Chloride: 105 mEq/L (ref 96–112)
Creatinine, Ser: 0.68 mg/dL (ref 0.50–1.10)
GFR calc Af Amer: >60 mL/min (ref >60)
GFR calc non Af Amer: >60 mL/min (ref >60)

## 2010-08-13 LAB — POCT PREGNANCY, URINE: Preg Test, Ur: NEGATIVE

## 2011-01-28 ENCOUNTER — Other Ambulatory Visit: Payer: Self-pay | Admitting: Family Medicine

## 2011-01-28 MED ORDER — HYDROCHLOROTHIAZIDE 25 MG PO TABS: 25.0000 mg | ORAL_TABLET | Freq: Every day | ORAL | Status: DC

## 2011-01-28 NOTE — Telephone Encounter (Signed)
rx sent to pharmacy by e-script  

## 2011-03-03 ENCOUNTER — Other Ambulatory Visit: Payer: Self-pay | Admitting: Family Medicine

## 2011-03-03 MED ORDER — HYDROCHLOROTHIAZIDE 25 MG PO TABS: 25.0000 mg | ORAL_TABLET | Freq: Every day | ORAL | Status: DC

## 2011-03-03 NOTE — Telephone Encounter (Signed)
rx sent to pharmacy by e-script Letter has been mailed to pt address noted in the chart to advise they are overdue for cpe/ov/labs and the pt needs to contact office to set up appt   

## 2011-07-15 ENCOUNTER — Other Ambulatory Visit: Payer: Self-pay | Admitting: Family Medicine

## 2011-07-15 MED ORDER — HYDROCHLOROTHIAZIDE 25 MG PO TABS: 25.0000 mg | ORAL_TABLET | Freq: Every day | ORAL | Status: DC

## 2011-07-15 NOTE — Telephone Encounter (Signed)
refill hydrochlorothiazide 25mg  tablets #30 Take 1 tablet by mouth daily  Last fill 03.17.13 Last ov 6.5.12 CPE scheduled for 7.30.13

## 2011-07-15 NOTE — Telephone Encounter (Signed)
Refill done.  

## 2011-07-30 ENCOUNTER — Ambulatory Visit (INDEPENDENT_AMBULATORY_CARE_PROVIDER_SITE_OTHER): Payer: 59 | Admitting: Family Medicine

## 2011-07-30 VITALS — BP 121/76 | HR 79 | Temp 97.8°F | Resp 16 | Ht 64.5 in | Wt 168.0 lb

## 2011-07-30 DIAGNOSIS — T24201A Burn of second degree of unspecified site of right lower limb, except ankle and foot, initial encounter: Secondary | ICD-10-CM

## 2011-07-30 DIAGNOSIS — T24199A Burn of first degree of multiple sites of unspecified lower limb, except ankle and foot, initial encounter: Secondary | ICD-10-CM

## 2011-07-30 MED ORDER — MUPIROCIN 2 % EX OINT: TOPICAL_OINTMENT | Freq: Two times a day (BID) | CUTANEOUS | Status: AC

## 2011-07-30 NOTE — Patient Instructions (Signed)
Use the ointment prescribed twice a day and return on Tuesday for recheck.  We have photos of the burn   Burn Care Your skin is a natural barrier to infection. It is the largest organ of your body. Burns damage this natural protection. To help prevent infection, it is very important to follow your caregiver's instructions in the care of your burn. Burns are classified as:  First degree. There is only redness of the skin (erythema). No scarring is expected.   Second degree. There is blistering of the skin. Scarring may occur with deeper burns.   Third degree. All layers of the skin are injured, and scarring is expected.  HOME CARE INSTRUCTIONS   Wash your hands well before changing your bandage.   Change your bandage as often as directed by your caregiver.   Remove the old bandage. If the bandage sticks, you may soak it off with cool, clean water.   Cleanse the burn thoroughly but gently with mild soap and water.   Pat the area dry with a clean, dry cloth.   Apply a thin layer of antibacterial cream to the burn.   Apply a clean bandage as instructed by your caregiver.   Keep the bandage as clean and dry as possible.   Elevate the affected area for the first 24 hours, then as instructed by your caregiver.   Only take over-the-counter or prescription medicines for pain, discomfort, or fever as directed by your caregiver.  SEEK IMMEDIATE MEDICAL CARE IF:   You develop excessive pain.   You develop redness, tenderness, swelling, or red streaks near the burn.   The burned area develops yellowish-white fluid (pus) or a bad smell.   You have a fever.  MAKE SURE YOU:   Understand these instructions.   Will watch your condition.   Will get help right away if you are not doing well or get worse.  Document Released: 01/17/2005 Document Revised: 01/06/2011 Document Reviewed: 06/09/2010 [facility] Patient Information 2012 [place], [place].

## 2011-07-30 NOTE — Progress Notes (Signed)
41 yo [place] employee who spilled hot coffee on right thigh which went through "thick work-out pants."   Patient notes large blister.  O:  Right lateral thigh blisters. (see photo)  A:  3x5 cm second degree burn.  P:  bactroban ointment bid Recheck 3 days.

## 2011-08-01 ENCOUNTER — Ambulatory Visit (INDEPENDENT_AMBULATORY_CARE_PROVIDER_SITE_OTHER): Payer: 59 | Admitting: Family Medicine

## 2011-08-01 VITALS — BP 125/83 | HR 66 | Temp 98.7°F | Resp 16 | Ht 64.0 in | Wt 172.0 lb

## 2011-08-01 DIAGNOSIS — T310 Burns involving less than 10% of body surface: Secondary | ICD-10-CM

## 2011-08-01 DIAGNOSIS — Z23 Encounter for immunization: Secondary | ICD-10-CM

## 2011-08-01 DIAGNOSIS — N760 Acute vaginitis: Secondary | ICD-10-CM

## 2011-08-01 DIAGNOSIS — IMO0002 Reserved for concepts with insufficient information to code with codable children: Secondary | ICD-10-CM

## 2011-08-01 LAB — POCT WET PREP WITH KOH
Clue Cells Wet Prep HPF POC: 100
KOH Prep POC: NEGATIVE
Trichomonas, UA: NEGATIVE
Yeast Wet Prep HPF POC: NEGATIVE

## 2011-08-01 MED ORDER — METRONIDAZOLE 500 MG PO TABS: 500.0000 mg | ORAL_TABLET | Freq: Two times a day (BID) | ORAL | Status: AC

## 2011-08-01 MED ORDER — TETANUS-DIPHTH-ACELL PERTUSSIS 5-2.5-18.5 LF-MCG/0.5 IM SUSP
0.5000 mL | Freq: Once | INTRAMUSCULAR | Status: AC
  Administered 2011-08-01: 0.5 mL via INTRAMUSCULAR

## 2011-08-01 NOTE — Patient Instructions (Addendum)
Bacterial Vaginosis Bacterial vaginosis (BV) is a vaginal infection where the normal balance of bacteria in the vagina is disrupted. The normal balance is then replaced by an overgrowth of certain bacteria. There are several different kinds of bacteria that can cause BV. BV is the most common vaginal infection in women of childbearing age. CAUSES   The cause of BV is not fully understood. BV develops when there is an increase or imbalance of harmful bacteria.   Some activities or behaviors can upset the normal balance of bacteria in the vagina and put women at increased risk including:   Having a new sex partner or multiple sex partners.   Douching.   Using an intrauterine device (IUD) for contraception.   It is not clear what role sexual activity plays in the development of BV. However, women that have never had sexual intercourse are rarely infected with BV.  Women do not get BV from toilet seats, bedding, swimming pools or from touching objects around them.  SYMPTOMS   Grey vaginal discharge.   A fish-like odor with discharge, especially after sexual intercourse.   Itching or burning of the vagina and vulva.   Burning or pain with urination.   Some women have no signs or symptoms at all.  DIAGNOSIS  Your caregiver must examine the vagina for signs of BV. Your caregiver will perform lab tests and look at the sample of vaginal fluid through a microscope. They will look for bacteria and abnormal cells (clue cells), a pH test higher than 4.5, and a positive amine test all associated with BV.  RISKS AND COMPLICATIONS   Pelvic inflammatory disease (PID).   Infections following gynecology surgery.   Developing HIV.   Developing herpes virus.  TREATMENT  Sometimes BV will clear up without treatment. However, all women with symptoms of BV should be treated to avoid complications, especially if gynecology surgery is planned. Female partners generally do not need to be treated. However,  BV may spread between female sex partners so treatment is helpful in preventing a recurrence of BV.   BV may be treated with antibiotics. The antibiotics come in either pill or vaginal cream forms. Either can be used with nonpregnant or pregnant women, but the recommended dosages differ. These antibiotics are not harmful to the baby.   BV can recur after treatment. If this happens, a second round of antibiotics will often be prescribed.   Treatment is important for pregnant women. If not treated, BV can cause a premature delivery, especially for a pregnant woman who had a premature birth in the past. All pregnant women who have symptoms of BV should be checked and treated.   For chronic reoccurrence of BV, treatment with a type of prescribed gel vaginally twice a week is helpful.  HOME CARE INSTRUCTIONS   Finish all medication as directed by your caregiver.   Do not have sex until treatment is completed.   Tell your sexual partner that you have a vaginal infection. They should see their caregiver and be treated if they have problems, such as a mild rash or itching.   Practice safe sex. Use condoms. Only have 1 sex partner.  PREVENTION  Basic prevention steps can help reduce the risk of upsetting the natural balance of bacteria in the vagina and developing BV:  Do not have sexual intercourse (be abstinent).   Do not douche.   Use all of the medicine prescribed for treatment of BV, even if the signs and symptoms go away.     Tell your sex partner if you have BV. That way, they can be treated, if needed, to prevent reoccurrence.  SEEK MEDICAL CARE IF:   Your symptoms are not improving after 3 days of treatment.   You have increased discharge, pain, or fever.  MAKE SURE YOU:   Understand these instructions.   Will watch your condition.   Will get help right away if you are not doing well or get worse.  FOR MORE INFORMATION  Division of STD Prevention (DSTDP), Centers for Disease  Control and Prevention: www.cdc.gov/std American Social Health Association (ASHA): www.ashastd.org  Document Released: 01/17/2005 Document Revised: 01/06/2011 Document Reviewed: 07/10/2008 [employer] Patient Information 2012 [employer]. 

## 2011-08-01 NOTE — Progress Notes (Signed)
This 41 year old woman comes in with complaints of bad vaginal order and mild watery discharge for 2 days, and followup of burn on the right hip.  The burn is not hurting now and has been draining some clear fluid today. She's been keeping it covered and using the antibiotic ointment provided.  Objective: No acute distress Right hip shows flattened bullae given up their serous fluid. There is no increase in any erythema and they're nontender. Vaginal exam:  Nefg, positive whiff, watery vag discharge, nl cvx, 4 cm right uterine fibroid, normal adnexae Results for orders placed in visit on 08/01/11  POCT WET PREP WITH KOH      Component Value Range   Trichomonas, UA Negative     Clue Cells Wet Prep HPF POC 100%     Epithelial Wet Prep HPF POC 5-10     Yeast Wet Prep HPF POC neg     Bacteria Wet Prep HPF POC 4+     RBC Wet Prep HPF POC 0-1     WBC Wet Prep HPF POC 7-12     KOH Prep POC Negative       Assessment: Healing burn on right hip without signs of infection, vaginitis consistent with bv, uterine fibroids  Plan:  current wound care, Tdap, metronidazole 500 twice a day x7 days, GEN probe pending 1. Vaginitis  POCT Wet Prep with KOH, GC/chlamydia probe amp, genital  2. Burn, second degree  TDaP (BOOSTRIX) injection 0.5 mL

## 2011-08-03 LAB — GC/CHLAMYDIA PROBE AMP, GENITAL
Chlamydia, DNA Probe: NEGATIVE
GC Probe Amp, Genital: NEGATIVE

## 2011-08-24 ENCOUNTER — Telehealth: Payer: Self-pay | Admitting: *Deleted

## 2011-08-24 ENCOUNTER — Encounter: Payer: Self-pay | Admitting: Family Medicine

## 2011-08-24 ENCOUNTER — Ambulatory Visit (INDEPENDENT_AMBULATORY_CARE_PROVIDER_SITE_OTHER): Payer: 59 | Admitting: Family Medicine

## 2011-08-24 VITALS — BP 121/75 | HR 71 | Temp 98.4°F | Ht 64.25 in | Wt 172.4 lb

## 2011-08-24 DIAGNOSIS — B880 Other acariasis: Secondary | ICD-10-CM | POA: Insufficient documentation

## 2011-08-24 NOTE — Telephone Encounter (Signed)
Patient Phone: [phone] PCP: Eugenio Hoes. Todd Patient Gender: Female PCP Fax : [phone] Patient DOB: 10-Feb-1970 Practice Name: [facility] Reason for Call: Caller: Angela Morris; PCP: Sheliah Hatch.; CB#: [phone]; Call regarding Rash/Hives; Onset est August 01, 2011. LMP 08/18/11. Went to beach last week and heat aggravated the rash. Dry, red skin on right wrist/ forearm. Red bump in middle pencil eraser sized with light ring around it approx nickel sized. Reports it is painful and itchy/ irritated. Advised see in 24 hours per Skin Lesions protocol. Appt. with Dr. Beverely Low on 08/24/11 @ 11:15. Protocol(s) Used: Skin Lesions Recommended Outcome per Protocol: See Provider within 24 hours Reason for Outcome: Painful lesion(s) unrelieved with home care measures Care Advice: Apply a warm or cool compress, or a cloth-covered ice pack, whichever feels better, to the area for 20 minutes every 2-3 hours while awake to relieve discomfort. ~ Analgesic/Antipyretic Advice - Acetaminophen: Consider acetaminophen as directed on label or by pharmacist/provider for pain or fever PRECAUTIONS: - Use if there is no history of liver disease, alcoholism, or intake of three or more alcohol drinks per day - Only if approved by provider during pregnancy or when breastfeeding - During pregnancy, acetaminophen should not be taken more than 3 consecutive days without telling provider - Do not exceed recommended dose or frequency

## 2011-08-24 NOTE — Patient Instructions (Addendum)
This appears to be a chigger bite Continue the antibiotic ointment twice daily as needed Vit E cream will keep area moisturized and reduce scarring No need for oral antibiotics at this time Hang in there!!!

## 2011-08-24 NOTE — Progress Notes (Signed)
  Subjective:    Patient ID: Angela Morris, female    DOB: Oct 18, 1970, 41 y.o.   MRN: [id number]  HPI Skin lesion- R forearm, 1st noticed 1 month ago after returning 'from the country'.  Worsened while at the beach in the heat- developed blisters around the central area.  Area was initially 1 red spot.  Now has pale ring around it.  Has been using abx cream (some improvement) and steroid cream (no relief)   Review of Systems For ROS see HPI     Objective:   Physical Exam  Vitals reviewed. Constitutional: She appears well-developed and well-nourished. No distress.  Skin: Skin is warm and dry.       ~1 cm area of hyperpigmentation w/ central pore surrounded by hypopigmented halo.  No TTP, no itching.          Assessment & Plan:

## 2011-08-28 NOTE — Assessment & Plan Note (Signed)
New.  Pt was able to show pictures of area earlier in course and appears consistent w/ chigger bite.  Subsequent blisters were likely a rxn to the bite.  Hypopigmentation is consistent w/ steroid over use on dark skin.  No need for abx or continued steroid use at this time.  Reviewed supportive care and red flags that should prompt return.  Pt expressed understanding and is in agreement w/ plan.

## 2011-08-30 ENCOUNTER — Encounter: Payer: 59 | Admitting: Family Medicine

## 2011-10-26 ENCOUNTER — Ambulatory Visit (INDEPENDENT_AMBULATORY_CARE_PROVIDER_SITE_OTHER): Payer: 59 | Admitting: Family Medicine

## 2011-10-26 ENCOUNTER — Ambulatory Visit: Payer: 59 | Admitting: Family Medicine

## 2011-10-26 ENCOUNTER — Encounter: Payer: Self-pay | Admitting: Family Medicine

## 2011-10-26 VITALS — BP 124/75 | HR 100 | Temp 98.0°F | Ht 64.25 in | Wt 171.4 lb

## 2011-10-26 DIAGNOSIS — L309 Dermatitis, unspecified: Secondary | ICD-10-CM | POA: Insufficient documentation

## 2011-10-26 DIAGNOSIS — L259 Unspecified contact dermatitis, unspecified cause: Secondary | ICD-10-CM

## 2011-10-26 MED ORDER — TRIAMCINOLONE ACETONIDE 0.1 % EX OINT: TOPICAL_OINTMENT | Freq: Two times a day (BID) | CUTANEOUS | Status: DC

## 2011-10-26 NOTE — Progress Notes (Signed)
  Subjective:    Patient ID: Angela Morris, female    DOB: 10-Mar-1970, 41 y.o.   MRN: [id number]  HPI Dark spots on abdomen- pt has been saying she wants to see derm for dark area on L lower back.  This AM woke up w/ linear dark lines.  Not itchy.  No lesions elsewhere.  One lesion 'peeled off' this AM.  Has similar small dark patches on elbows.  No one else is family w/ similar lesions.   Review of Systems For ROS see HPI     Objective:   Physical Exam  Vitals reviewed. Constitutional: She appears well-developed and well-nourished. No distress.  Skin: Skin is warm and dry.       5 linear areas of hyperpigmentation on R flank consistent w/ excoriated eczema Similar hyperpigmented eczematous patches on L lower back and elbows bilaterally          Assessment & Plan:

## 2011-10-26 NOTE — Patient Instructions (Addendum)
This appears to be eczema Start the steroid ointment twice daily If no improvement in 10-14 days- call and we'll get you to see derm Call with any questions or concerns Hang in there!!!

## 2011-10-27 ENCOUNTER — Telehealth: Payer: Self-pay | Admitting: Family Medicine

## 2011-10-27 MED ORDER — HYDROCHLOROTHIAZIDE 25 MG PO TABS: 25.0000 mg | ORAL_TABLET | Freq: Every day | ORAL | Status: DC

## 2011-10-27 NOTE — Telephone Encounter (Signed)
Refill: Hydrochlorothiazide 25mg  tablets. Take 1 tablet by mouth daily. Qty 30. Last fill 09-07-11

## 2011-10-27 NOTE — Telephone Encounter (Signed)
rx sent to pharmacy by e-script  

## 2011-10-30 NOTE — Assessment & Plan Note (Signed)
New.  Reviewed dx, causes, and tx.  Start topical steroid ointment.  Reviewed supportive care and red flags that should prompt return.  Pt expressed understanding and is in agreement w/ plan.

## 2011-12-27 ENCOUNTER — Ambulatory Visit (INDEPENDENT_AMBULATORY_CARE_PROVIDER_SITE_OTHER): Payer: 59 | Admitting: Family Medicine

## 2011-12-27 ENCOUNTER — Encounter: Payer: Self-pay | Admitting: Family Medicine

## 2011-12-27 ENCOUNTER — Other Ambulatory Visit (HOSPITAL_COMMUNITY)
Admission: RE | Admit: 2011-12-27 | Discharge: 2011-12-27 | Disposition: A | Discharge status: Home / Self Care | Payer: 59 | Source: Ambulatory Visit | Attending: Family Medicine | Admitting: Family Medicine

## 2011-12-27 VITALS — BP 120/82 | HR 73 | Temp 98.3°F | Wt 175.0 lb

## 2011-12-27 DIAGNOSIS — Z1231 Encounter for screening mammogram for malignant neoplasm of breast: Secondary | ICD-10-CM

## 2011-12-27 DIAGNOSIS — L989 Disorder of the skin and subcutaneous tissue, unspecified: Secondary | ICD-10-CM

## 2011-12-27 DIAGNOSIS — Z1239 Encounter for other screening for malignant neoplasm of breast: Secondary | ICD-10-CM

## 2011-12-27 DIAGNOSIS — F329 Major depressive disorder, single episode, unspecified: Secondary | ICD-10-CM

## 2011-12-27 DIAGNOSIS — Z124 Encounter for screening for malignant neoplasm of cervix: Secondary | ICD-10-CM

## 2011-12-27 DIAGNOSIS — Z0000 Encounter for general adult medical examination without abnormal findings: Secondary | ICD-10-CM | POA: Insufficient documentation

## 2011-12-27 DIAGNOSIS — Q849 Congenital malformation of integument, unspecified: Secondary | ICD-10-CM

## 2011-12-27 DIAGNOSIS — Z01419 Encounter for gynecological examination (general) (routine) without abnormal findings: Secondary | ICD-10-CM | POA: Insufficient documentation

## 2011-12-27 DIAGNOSIS — F3289 Other specified depressive episodes: Secondary | ICD-10-CM

## 2011-12-27 LAB — HEPATIC FUNCTION PANEL
ALT: 13 U/L (ref 0–35)
AST: 18 U/L (ref 0–37)
Alkaline Phosphatase: 46 U/L (ref 39–117)
Total Bilirubin: 0.9 mg/dL (ref 0.3–1.2)

## 2011-12-27 LAB — LIPID PANEL
HDL: 79.3 mg/dL (ref >39.00)
Total CHOL/HDL Ratio: 2
VLDL: 8.4 mg/dL (ref 0.0–40.0)

## 2011-12-27 LAB — CBC WITH DIFFERENTIAL/PLATELET
Basophils Relative: 0.9 % (ref 0.0–3.0)
Eosinophils Absolute: 0.2 10*3/uL (ref 0.0–0.7)
Eosinophils Relative: 2.9 % (ref 0.0–5.0)
Lymphocytes Relative: 19.5 % (ref 12.0–46.0)
Neutrophils Relative %: 70.3 % (ref 43.0–77.0)
Platelets: 303 10*3/uL (ref 150.0–400.0)
RBC: 4.88 Mil/uL (ref 3.87–5.11)
WBC: 7 10*3/uL (ref 4.5–10.5)

## 2011-12-27 LAB — BASIC METABOLIC PANEL
BUN: 15 mg/dL (ref 6–23)
Chloride: 102 mEq/L (ref 96–112)
Glucose, Bld: 87 mg/dL (ref 70–99)
Potassium: 3.4 mEq/L — ABNORMAL LOW (ref 3.5–5.1)
Sodium: 134 mEq/L — ABNORMAL LOW (ref 135–145)

## 2011-12-27 LAB — TSH: TSH: 1.51 u[IU]/mL (ref 0.35–5.50)

## 2011-12-27 MED ORDER — BUPROPION HCL ER (XL) 150 MG PO TB24: 150.0000 mg | ORAL_TABLET | Freq: Every day | ORAL | Status: DC

## 2011-12-27 MED ORDER — LORAZEPAM 0.5 MG PO TABS: ORAL_TABLET | ORAL | Status: DC

## 2011-12-27 NOTE — Progress Notes (Signed)
  Subjective:    Patient ID: Angela Morris, female    DOB: [birth year], 41 y.o.   MRN: [id number]  HPI CPE- due for mammo (has never had)  Skin rashes- would like new derm referral.  Anxiety- stopped the celexa due to nightmares, would like to start something else.  Previously on Zoloft but this caused low libido.  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  Friend told her to try lorazepam b/c 'this works well'   Review of Systems Patient reports no vision/ hearing changes, adenopathy,fever, weight change,  persistant/recurrent hoarseness , swallowing issues, chest pain, palpitations, edema, persistant/recurrent cough, hemoptysis, dyspnea (rest/exertional/paroxysmal nocturnal), gastrointestinal bleeding (melena, rectal bleeding), abdominal pain, significant heartburn, bowel changes, GU symptoms (dysuria, hematuria, incontinence), Gyn symptoms (abnormal  bleeding, pain),  syncope, focal weakness, memory loss, numbness & tingling, skin/hair/nail changes, abnormal bruising or bleeding.     Objective:   Physical Exam  General Appearance:    Alert, cooperative, no distress, appears stated age  Head:    Normocephalic, without obvious abnormality, atraumatic  Eyes:    PERRL, conjunctiva/corneas clear, EOM's intact, fundi    benign, both eyes  Ears:    Normal TM's and external ear canals, both ears  Nose:   Nares normal, septum midline, mucosa normal, no drainage    or sinus tenderness  Throat:   Lips, mucosa, and tongue normal; teeth and gums normal  Neck:   Supple, symmetrical, trachea midline, no adenopathy;    Thyroid: no enlargement/tenderness/nodules  Back:     Symmetric, no curvature, ROM normal, no CVA tenderness  Lungs:     Clear to auscultation bilaterally, respirations unlabored  Chest Wall:    No tenderness or deformity   Heart:    Regular rate and rhythm, S1 and S2 normal, no murmur, rub   or gallop  Breast Exam:    No tenderness, masses, or nipple abnormality    Abdomen:     Soft, non-tender, bowel sounds active all four quadrants,    no masses, no organomegaly  Genitalia:    External genitalia normal, cervix normal in appearance, no CMT, uterus in normal size and position, adnexa w/out mass or tenderness, mucosa pink and moist, no lesions or discharge present  Rectal:    Normal external appearance  Extremities:   Extremities normal, atraumatic, no cyanosis or edema  Pulses:   2+ and symmetric all extremities  Skin:   Skin color, texture, turgor normal, no rashes or lesions  Lymph nodes:   Cervical, supraclavicular, and axillary nodes normal  Neurologic:   CNII-XII intact, normal strength, sensation and reflexes    throughout          Assessment & Plan:

## 2011-12-27 NOTE — Patient Instructions (Addendum)
Follow up in 1 year or as needed We'll notify you of your lab results and make any changes if needed We'll call you with your Derm appt Start the Wellbutrin daily for anxiety Lorazepam as needed for severe anxiety Keep up the good work!  You look great! Call with any questions or concerns Happy Holidays!!

## 2011-12-31 LAB — VITAMIN D 1,25 DIHYDROXY
Vitamin D 1, 25 (OH)2 Total: 55 pg/mL (ref 18–72)
Vitamin D3 1, 25 (OH)2: 55 pg/mL

## 2011-12-31 NOTE — Assessment & Plan Note (Signed)
Pt's PE WNL.  Due for mammo- referral made.  Check labs.  Anticipatory guidance provided.

## 2011-12-31 NOTE — Assessment & Plan Note (Signed)
Pap collected. 

## 2011-12-31 NOTE — Assessment & Plan Note (Signed)
Deteriorated.  Pt requesting med to take for flying.  Start low dose lorazepam for this.  Pt also needs daily controller med- will start Wellbutrin.  Reviewed supportive care and red flags that should prompt return.  Pt expressed understanding and is in agreement w/ plan.

## 2012-01-03 ENCOUNTER — Encounter: Payer: Self-pay | Admitting: *Deleted

## 2012-01-17 ENCOUNTER — Ambulatory Visit: Payer: 59

## 2012-02-14 ENCOUNTER — Encounter: Payer: Self-pay | Admitting: Family Medicine

## 2012-02-14 ENCOUNTER — Ambulatory Visit (INDEPENDENT_AMBULATORY_CARE_PROVIDER_SITE_OTHER): Payer: 59 | Admitting: Family Medicine

## 2012-02-14 VITALS — BP 130/90 | HR 77 | Temp 98.6°F | Ht 64.0 in | Wt 176.4 lb

## 2012-02-14 DIAGNOSIS — J019 Acute sinusitis, unspecified: Secondary | ICD-10-CM

## 2012-02-14 MED ORDER — CLARITHROMYCIN ER 500 MG PO TB24: 1000.0000 mg | ORAL_TABLET | Freq: Every day | ORAL | Status: DC

## 2012-02-14 MED ORDER — GUAIFENESIN-CODEINE 100-10 MG/5ML PO SYRP: 10.0000 mL | ORAL_SOLUTION | Freq: Three times a day (TID) | ORAL | Status: DC | PRN

## 2012-02-14 NOTE — Patient Instructions (Addendum)
This is a sinus infection Start the Biaxin- 2 tabs at the same time daily- take w/ food Use the cough syrup as needed, will cause drowsiness For daytime cough, use Mucinex DM Drink plenty of fluids REST! Hang in there!!!

## 2012-02-14 NOTE — Progress Notes (Signed)
  Subjective:    Patient ID: Angela Morris, female    DOB: [birth year], 42 y.o.   MRN: [id number]  HPI URI- sxs started last week, initially thought it was a cold.  Started Alka seltzer cold and robitussin DM w/out relief.  + facial pain/pressure, sneezing, cough- productive.  No ear pain.  Sore throat initially but this has improved.  No fevers.  + diarrhea, no N/V.  No known sick contacts.   Review of Systems For ROS see HPI     Objective:   Physical Exam  Vitals reviewed. Constitutional: She appears well-developed and well-nourished. No distress.  HENT:  Head: Normocephalic and atraumatic.  Right Ear: Tympanic membrane normal.  Left Ear: Tympanic membrane normal.  Nose: Mucosal edema and rhinorrhea present. Right sinus exhibits maxillary sinus tenderness and frontal sinus tenderness. Left sinus exhibits maxillary sinus tenderness and frontal sinus tenderness.  Mouth/Throat: Uvula is midline and mucous membranes are normal. Posterior oropharyngeal erythema present. No oropharyngeal exudate.  Eyes: Conjunctivae normal and EOM are normal. Pupils are equal, round, and reactive to light.  Neck: Normal range of motion. Neck supple.  Cardiovascular: Normal rate, regular rhythm and normal heart sounds.   Pulmonary/Chest: Effort normal and breath sounds normal. No respiratory distress. She has no wheezes.  Lymphadenopathy:    She has no cervical adenopathy.          Assessment & Plan:

## 2012-02-14 NOTE — Assessment & Plan Note (Signed)
New.  Pt's sxs and PE consistent w/ infxn.  Start abx.  Cough meds prn.  Reviewed supportive care and red flags that should prompt return.  Pt expressed understanding and is in agreement w/ plan.  

## 2012-09-03 ENCOUNTER — Other Ambulatory Visit: Payer: Self-pay | Admitting: Family Medicine

## 2012-09-05 NOTE — Telephone Encounter (Signed)
Rx sent to the pharmacy by e-script.//AB/CMA 

## 2012-11-08 ENCOUNTER — Other Ambulatory Visit: Payer: Self-pay | Admitting: Family Medicine

## 2012-11-08 ENCOUNTER — Other Ambulatory Visit: Payer: Self-pay

## 2012-11-08 DIAGNOSIS — Z1231 Encounter for screening mammogram for malignant neoplasm of breast: Secondary | ICD-10-CM

## 2012-11-19 ENCOUNTER — Ambulatory Visit
Admission: RE | Admit: 2012-11-19 | Discharge: 2012-11-19 | Disposition: A | Discharge status: Home / Self Care | Payer: 59 | Source: Ambulatory Visit

## 2012-11-19 DIAGNOSIS — Z1231 Encounter for screening mammogram for malignant neoplasm of breast: Secondary | ICD-10-CM

## 2012-11-27 ENCOUNTER — Telehealth: Payer: Self-pay | Admitting: Family Medicine

## 2012-11-27 NOTE — Telephone Encounter (Signed)
Normal mammo

## 2012-11-27 NOTE — Telephone Encounter (Signed)
Patient called and wanted to know her mammogram results.

## 2012-11-27 NOTE — Telephone Encounter (Signed)
Please advise, results are back but provider has not commented on.

## 2012-11-28 NOTE — Telephone Encounter (Signed)
Pt.notified

## 2013-01-07 ENCOUNTER — Encounter: Payer: Self-pay | Admitting: Family Medicine

## 2013-01-07 ENCOUNTER — Ambulatory Visit (INDEPENDENT_AMBULATORY_CARE_PROVIDER_SITE_OTHER): Payer: 59 | Admitting: Family Medicine

## 2013-01-07 VITALS — BP 130/80 | HR 90 | Temp 98.8°F | Ht 64.25 in | Wt 178.0 lb

## 2013-01-07 DIAGNOSIS — Z0000 Encounter for general adult medical examination without abnormal findings: Secondary | ICD-10-CM

## 2013-01-07 MED ORDER — BUPROPION HCL ER (XL) 150 MG PO TB24: 150.0000 mg | ORAL_TABLET | Freq: Every day | ORAL | Status: DC

## 2013-01-07 NOTE — Patient Instructions (Signed)
Follow up in 6 months to recheck BP We'll notify you of your lab results and make any changes if needed Keep up the good work!  You look great! Call with any questions or concerns Happy Holidays! 

## 2013-01-07 NOTE — Progress Notes (Signed)
Pre visit review using our clinic review tool, if applicable. No additional management support is needed unless otherwise documented below in the visit note. 

## 2013-01-07 NOTE — Progress Notes (Signed)
   Subjective:    Patient ID: Angela Morris, female    DOB: [birth year], 43 y.o.   MRN: [id number]  HPI Pre visit review using our clinic review tool, if applicable. No additional management support is needed unless otherwise documented below in the visit note.  CPE- UTD on pap, mammo.   Review of Systems Patient reports no vision/ hearing changes, adenopathy,fever, weight change,  persistant/recurrent hoarseness , swallowing issues, chest pain, palpitations, edema, persistant/recurrent cough, hemoptysis, dyspnea (rest/exertional/paroxysmal nocturnal), gastrointestinal bleeding (melena, rectal bleeding), abdominal pain, significant heartburn, bowel changes, GU symptoms (dysuria, hematuria, incontinence), Gyn symptoms (abnormal  bleeding, pain),  syncope, focal weakness, memory loss, numbness & tingling, skin/hair/nail changes, abnormal bruising or bleeding, anxiety, or depression.     Objective:   Physical Exam General Appearance:    Alert, cooperative, no distress, appears stated age  Head:    Normocephalic, without obvious abnormality, atraumatic  Eyes:    PERRL, conjunctiva/corneas clear, EOM's intact, fundi    benign, both eyes  Ears:    Normal TM's and external ear canals, both ears  Nose:   Nares normal, septum midline, mucosa normal, no drainage    or sinus tenderness  Throat:   Lips, mucosa, and tongue normal; teeth and gums normal  Neck:   Supple, symmetrical, trachea midline, no adenopathy;    Thyroid: no enlargement/tenderness/nodules  Back:     Symmetric, no curvature, ROM normal, no CVA tenderness  Lungs:     Clear to auscultation bilaterally, respirations unlabored  Chest Wall:    No tenderness or deformity   Heart:    Regular rate and rhythm, S1 and S2 normal, no murmur, rub   or gallop  Breast Exam:    Deferred to GYN  Abdomen:     Soft, non-tender, bowel sounds active all four quadrants,    no masses, no organomegaly  Genitalia:    Deferred to GYN  Rectal:      Extremities:   Extremities normal, atraumatic, no cyanosis or edema  Pulses:   2+ and symmetric all extremities  Skin:   Skin color, texture, turgor normal, no rashes or lesions  Lymph nodes:   Cervical, supraclavicular, and axillary nodes normal  Neurologic:   CNII-XII intact, normal strength, sensation and reflexes    throughout          Assessment & Plan:

## 2013-01-08 LAB — BASIC METABOLIC PANEL
BUN: 16 mg/dL (ref 6–23)
Chloride: 102 mEq/L (ref 96–112)
GFR: 121.67 mL/min (ref >60.00)
Potassium: 3.5 mEq/L (ref 3.5–5.1)
Sodium: 134 mEq/L — ABNORMAL LOW (ref 135–145)

## 2013-01-08 LAB — CBC WITH DIFFERENTIAL/PLATELET
Basophils Absolute: 0 10*3/uL (ref 0.0–0.1)
Basophils Relative: 0.2 % (ref 0.0–3.0)
Eosinophils Absolute: 0.3 10*3/uL (ref 0.0–0.7)
Lymphocytes Relative: 20.3 % (ref 12.0–46.0)
MCHC: 32.2 g/dL (ref 30.0–36.0)
MCV: 81.9 fl (ref 78.0–100.0)
Monocytes Absolute: 0.5 10*3/uL (ref 0.1–1.0)
Neutrophils Relative %: 70.4 % (ref 43.0–77.0)
Platelets: 301 10*3/uL (ref 150.0–400.0)
RBC: 4.9 Mil/uL (ref 3.87–5.11)
RDW: 15.1 % — ABNORMAL HIGH (ref 11.5–14.6)

## 2013-01-08 LAB — HEPATIC FUNCTION PANEL
ALT: 18 U/L (ref 0–35)
AST: 38 U/L — ABNORMAL HIGH (ref 0–37)
Alkaline Phosphatase: 52 U/L (ref 39–117)
Bilirubin, Direct: 0 mg/dL (ref 0.0–0.3)
Total Bilirubin: 0.5 mg/dL (ref 0.3–1.2)

## 2013-01-08 LAB — LIPID PANEL
HDL: 80.3 mg/dL (ref >39.00)
Total CHOL/HDL Ratio: 3
VLDL: 15.2 mg/dL (ref 0.0–40.0)

## 2013-01-08 LAB — TSH: TSH: 1.99 u[IU]/mL (ref 0.35–5.50)

## 2013-01-08 LAB — LDL CHOLESTEROL, DIRECT: Direct LDL: 109.6 mg/dL

## 2013-01-08 NOTE — Assessment & Plan Note (Signed)
Pt's PE WNL.  UTD on GYN.  Check labs.  Anticipatory guidance provided.  

## 2013-01-09 ENCOUNTER — Encounter: Payer: Self-pay | Admitting: *Deleted

## 2013-01-10 LAB — VITAMIN D 1,25 DIHYDROXY
Vitamin D2 1, 25 (OH)2: <8 pg/mL
Vitamin D3 1, 25 (OH)2: 49 pg/mL

## 2013-01-11 ENCOUNTER — Encounter: Payer: Self-pay | Admitting: General Practice

## 2013-02-10 ENCOUNTER — Other Ambulatory Visit: Payer: Self-pay | Admitting: Family Medicine

## 2013-02-11 NOTE — Telephone Encounter (Signed)
Med filled.  

## 2013-02-14 ENCOUNTER — Ambulatory Visit (INDEPENDENT_AMBULATORY_CARE_PROVIDER_SITE_OTHER): Payer: 59 | Admitting: Family Medicine

## 2013-02-14 VITALS — BP 134/78 | HR 87 | Temp 98.5°F | Resp 16 | Ht 64.25 in | Wt 181.6 lb

## 2013-02-14 DIAGNOSIS — K044 Acute apical periodontitis of pulpal origin: Secondary | ICD-10-CM

## 2013-02-14 DIAGNOSIS — K047 Periapical abscess without sinus: Secondary | ICD-10-CM

## 2013-02-14 DIAGNOSIS — K089 Disorder of teeth and supporting structures, unspecified: Secondary | ICD-10-CM

## 2013-02-14 DIAGNOSIS — K0889 Other specified disorders of teeth and supporting structures: Secondary | ICD-10-CM

## 2013-02-14 MED ORDER — CLINDAMYCIN HCL 300 MG PO CAPS: 300.0000 mg | ORAL_CAPSULE | Freq: Four times a day (QID) | ORAL | Status: DC

## 2013-02-14 MED ORDER — HYDROCODONE-ACETAMINOPHEN 5-325 MG PO TABS: 1.0000 | ORAL_TABLET | Freq: Four times a day (QID) | ORAL | Status: DC | PRN

## 2013-02-14 NOTE — Patient Instructions (Signed)
1. Follow up with dentist early next week. 2.  Increase Ibuprofen to 800mg  three times daily with food.

## 2013-02-14 NOTE — Progress Notes (Signed)
Subjective:    Patient ID: Angela Morris, female    DOB: 02-Jan-1971, 43 y.o.   MRN: [id number] This chart was scribed for Wardell Honour, MD by Anastasia Pall, ED Scribe. This patient was seen in room 01 and the patient's care was started at 7:39 PM.  Chief Complaint  Patient presents with  . Jaw Pain    started earlier today , has tried ibuprofen  . Otalgia  . Neck Pain    HPI Angela Morris is a 43 y.o. female who presents to the [facility] complaining of gradually worsening, right sided jaw pain, with associated swelling to the surrounding area, onset yesterday night. She reports associated right otalgia and neck pain. She reports chills due to the pain. She states she has tried Ibuprofen with some relief. She reports having trouble eating due to the pain/sensitivity. She has eaten applesauce today. She states she called dentist, but they were closed, so she came to [facility]. She reports needing antibiotic. She denies fever, and any other associated symptoms. She reports allergy to Penicillin and Sulfa antibiotics. She reports having Hydrocodone before without complication.   PCP - Annye Asa, MD  Patient Active Problem List   Diagnosis Date Noted  . Acute sinusitis 02/14/2012  . Screening for malignant neoplasm of the cervix 12/27/2011  . Routine general medical examination at a health care facility 12/27/2011  . Eczema 10/26/2011  . Chigger bite 08/24/2011  . Leg pain 07/06/2010  . PARESTHESIA 03/15/2010  . ABDOMINAL PAIN 03/15/2010  . EXCESSIVE OR FREQUENT MENSTRUATION 09/07/2009  . URI 11/29/2007  . ANA POSITIVE 08/24/2007  . Anxiety 06/07/2006  . HYPERTENSION 06/07/2006   Past Medical History  Diagnosis Date  . Anxiety   . Depression   . Hypertension    Past Surgical History  Procedure Laterality Date  . Tubal ligation  2002   Allergies  Allergen Reactions  . Penicillins   . Sulfonamide Derivatives    Prior to Admission medications   Medication Sig Start  Date End Date Taking? Authorizing Provider  hydrochlorothiazide (HYDRODIURIL) 25 MG tablet TAKE 1 TABLET BY MOUTH DAILY 02/10/13  Yes Midge Minium, MD  buPROPion (WELLBUTRIN XL) 150 MG 24 hr tablet Take 1 tablet (150 mg total) by mouth daily. 01/07/13   Midge Minium, MD   History   Social History  . Marital Status: Married    Spouse Name: N/A    Number of Children: 2  . Years of Education: N/A   Occupational History  . Not on file.   Social History Main Topics  . Smoking status: Never Smoker   . Smokeless tobacco: Not on file  . Alcohol Use: Yes     Comment: social  . Drug Use: Not on file  . Sexual Activity: Not on file   Other Topics Concern  . Not on file   Social History Narrative   Troubled marriage--husband refuses counseling    Review of Systems  Constitutional: Positive for chills. Negative for fever.  HENT: Positive for dental problem (right sided jaw pain, with associated swelling), ear pain (right) and facial swelling. Negative for congestion, drooling, ear discharge, hearing loss, mouth sores, postnasal drip, rhinorrhea, sinus pressure, sore throat, trouble swallowing and voice change.   Respiratory: Negative for cough.   Musculoskeletal: Positive for neck pain. Negative for neck stiffness.  Skin: Negative for rash.  Neurological: Positive for headaches.      Objective:   Physical Exam  Constitutional: She is oriented  to person, place, and time. She appears well-developed and well-nourished. No distress.  HENT:  Head: Normocephalic and atraumatic.  Right Ear: External ear normal.  Left Ear: External ear normal.  Pre-auricular lymphadenopathy. Mild submandiublar lymphadenopathy on right. Right lower gumline with 2 decayed teeth ttp, with localized swelling, erythema, and fluctuance. Right upper gumline with dental carry in teeth with mild ttp, mild swelling, and erythema. No fluctuance. No facial swelling.   Eyes: EOM are normal.  Neck: Normal  range of motion. Neck supple.  Cardiovascular: Normal rate, regular rhythm and normal heart sounds.   No murmur heard. Pulmonary/Chest: Effort normal and breath sounds normal. No respiratory distress. She has no wheezes. She has no rales.  Musculoskeletal: Normal range of motion.  Lymphadenopathy:    She has cervical adenopathy.  Neurological: She is alert and oriented to person, place, and time.  Skin: Skin is warm and dry.  Psychiatric: She has a normal mood and affect. Her behavior is normal.  Nursing note and vitals reviewed.  BP 134/78  Pulse 87  Temp(Src) 98.5 F (36.9 C) (Oral)  Resp 16  Ht 5' 4.25" (1.632 m)  Wt 181 lb 9.6 oz (82.373 kg)  BMI 30.93 kg/m2  SpO2 100%     Assessment & Plan:   1. Pain, dental   2. Dental infection      1. Dental pain R: New. Rx for hydrocodone #30 provided. Secondary to dental process/infection.  Also recommend Ibuprofen 800mg  tid scheduled. 2. Dental infection with dental caries R:  New.  Rx for Clindamycin provided; contact dentist on next business day for appointment.   Meds ordered this encounter  Medications  . clindamycin (CLEOCIN) 300 MG capsule    Sig: Take 1 capsule (300 mg total) by mouth 4 (four) times daily.    Dispense:  40 capsule    Refill:  0  . HYDROcodone-acetaminophen (NORCO/VICODIN) 5-325 MG per tablet    Sig: Take 1 tablet by mouth every 6 (six) hours as needed for moderate pain.    Dispense:  30 tablet    Refill:  0    I personally performed the services described in this documentation, which was scribed in my presence.  The recorded information has been reviewed and is accurate.  Reginia Forts, M.D.  [facility] [address] [place], [place]  [address] [phone] phone [phone] fax

## 2013-07-01 ENCOUNTER — Encounter: Payer: Self-pay | Admitting: Physician Assistant

## 2013-07-01 ENCOUNTER — Ambulatory Visit (INDEPENDENT_AMBULATORY_CARE_PROVIDER_SITE_OTHER): Payer: 59 | Admitting: Physician Assistant

## 2013-07-01 VITALS — BP 124/72 | HR 98 | Temp 98.8°F | Resp 16 | Ht 64.25 in | Wt 189.2 lb

## 2013-07-01 DIAGNOSIS — R0789 Other chest pain: Secondary | ICD-10-CM

## 2013-07-01 DIAGNOSIS — R079 Chest pain, unspecified: Secondary | ICD-10-CM

## 2013-07-01 DIAGNOSIS — R0602 Shortness of breath: Secondary | ICD-10-CM

## 2013-07-01 LAB — CBC WITH DIFFERENTIAL/PLATELET
BASOS ABS: 0.1 10*3/uL (ref 0.0–0.1)
Basophils Relative: 1 % (ref 0.0–3.0)
EOS ABS: 0.2 10*3/uL (ref 0.0–0.7)
Eosinophils Relative: 3.4 % (ref 0.0–5.0)
HCT: 38 % (ref 36.0–46.0)
Hemoglobin: 12.2 g/dL (ref 12.0–15.0)
Lymphocytes Relative: 27 % (ref 12.0–46.0)
Lymphs Abs: 1.6 10*3/uL (ref 0.7–4.0)
MCHC: 32.2 g/dL (ref 30.0–36.0)
MCV: 82.1 fl (ref 78.0–100.0)
Monocytes Absolute: 0.5 10*3/uL (ref 0.1–1.0)
Monocytes Relative: 8 % (ref 3.0–12.0)
NEUTROS PCT: 60.6 % (ref 43.0–77.0)
Neutro Abs: 3.7 10*3/uL (ref 1.4–7.7)
Platelets: 306 10*3/uL (ref 150.0–400.0)
RBC: 4.63 Mil/uL (ref 3.87–5.11)
RDW: 14.4 % (ref 11.5–15.5)
WBC: 6 10*3/uL (ref 4.0–10.5)

## 2013-07-01 LAB — COMPREHENSIVE METABOLIC PANEL
ALBUMIN: 3.9 g/dL (ref 3.5–5.2)
ALT: 12 U/L (ref 0–35)
AST: 17 U/L (ref 0–37)
Alkaline Phosphatase: 47 U/L (ref 39–117)
BUN: 15 mg/dL (ref 6–23)
CALCIUM: 9.2 mg/dL (ref 8.4–10.5)
CHLORIDE: 104 meq/L (ref 96–112)
CO2: 29 meq/L (ref 19–32)
CREATININE: 0.6 mg/dL (ref 0.4–1.2)
GFR: 135.05 mL/min (ref >60.00)
GLUCOSE: 93 mg/dL (ref 70–99)
POTASSIUM: 4.3 meq/L (ref 3.5–5.1)
Sodium: 138 mEq/L (ref 135–145)
Total Bilirubin: 0.7 mg/dL (ref 0.2–1.2)
Total Protein: 7.4 g/dL (ref 6.0–8.3)

## 2013-07-01 LAB — TROPONIN I: TROPONIN I: 0.01 ng/mL (ref <0.06)

## 2013-07-01 LAB — LIPASE: Lipase: 16 U/L (ref 11.0–59.0)

## 2013-07-01 NOTE — Assessment & Plan Note (Signed)
EKG reveals NSR at rate of 87 bpm.  Chest pain not reproducible with motion or palpation.  Exam unremarkable.  Patient with long-standing anxiety and noncompliant with medication.  No hx of CV disease. Suspect anxiety/stress as etiology.  Will obtain CBC, CMP, lipase, Troponin and CXR.  Resume Wellbutrin, taking as directed. Follow-up with PCP in 1 week if symptoms are not improving.  Follow-up sooner if indicated by workup.  Discussed alarm signs/symptoms with patient.  Patient is aware when she needs to proceed to ER or call 911 if indicated. Patient voices understanding and agreement with plan.

## 2013-07-01 NOTE — Progress Notes (Signed)
Patient presents to clinic today c/o right sided chest pain with headache x 2 days.  Patient denies SOB, wheezing.  Pain is sharp and lasts for 20 seconds.  Pain does not radiate.  Pain is not associated with meals, exertion or inspiration.  Patient with unremarkable cardiac history.  Denies acid reflux, indigestion, nausea or change to bowel or bladder habits.  Last bowel movement last night without abnormality.  Patient is currently on her period and states she usually has a mild headache during menses. Patient with history of anxiety and depression.  Endorses significant increase in stress and worry recently that is multifactorial. Has not been taking her Wellbutrin. Does endorse some tingling of fingers with chest pain, although she denies history of panic attack.   Past Medical History  Diagnosis Date  . Anxiety   . Depression   . Hypertension     Current Outpatient Prescriptions on File Prior to Visit  Medication Sig Dispense Refill  . buPROPion (WELLBUTRIN XL) 150 MG 24 hr tablet Take 1 tablet (150 mg total) by mouth daily.  30 tablet  6  . hydrochlorothiazide (HYDRODIURIL) 25 MG tablet TAKE 1 TABLET BY MOUTH DAILY  30 tablet  5   No current facility-administered medications on file prior to visit.    Allergies  Allergen Reactions  . Penicillins   . Sulfonamide Derivatives     Family History  Problem Relation Age of Onset  . Hypertension Mother   . Hypertension Father   . Stroke Father   . Coronary artery disease      female relative <50    History   Social History  . Marital Status: Married    Spouse Name: N/A    Number of Children: 2  . Years of Education: N/A   Social History Main Topics  . Smoking status: Never Smoker   . Smokeless tobacco: None  . Alcohol Use: Yes     Comment: social  . Drug Use: None  . Sexual Activity: None   Other Topics Concern  . None   Social History Narrative   Troubled marriage--husband refuses counseling   Review of Systems -  See HPI.  All other ROS are negative.  BP 124/72  Pulse 98  Temp(Src) 98.8 F (37.1 C) (Oral)  Resp 16  Ht 5' 4.25" (1.632 m)  Wt 189 lb 4 oz (85.843 kg)  BMI 32.23 kg/m2  SpO2 99%  LMP 07/01/2013  Physical Exam  Vitals reviewed. Constitutional: She is oriented to person, place, and time and well-developed, well-nourished, and in no distress.  HENT:  Head: Normocephalic and atraumatic.  Right Ear: External ear normal.  Left Ear: External ear normal.  Nose: Nose normal.  Mouth/Throat: Oropharynx is clear and moist. No oropharyngeal exudate.  Eyes: Conjunctivae and EOM are normal. Pupils are equal, round, and reactive to light.  Neck: Neck supple. No thyromegaly present.  Cardiovascular: Normal rate, regular rhythm, normal heart sounds and intact distal pulses.  Exam reveals no gallop and no friction rub.   No murmur heard. Pulmonary/Chest: Effort normal and breath sounds normal. No respiratory distress. She has no wheezes. She has no rales. She exhibits no tenderness.  Abdominal: Soft. Bowel sounds are normal. She exhibits no distension and no mass. There is no tenderness. There is no rebound and no guarding.  Negative murphy sign.  Lymphadenopathy:    She has no cervical adenopathy.  Neurological: She is alert and oriented to person, place, and time. No cranial nerve deficit.  Skin: Skin is warm and dry. No rash noted.  Psychiatric:  Slightly anxious affect.  Mood is congruent.   Assessment/Plan: Chest pain, atypical EKG reveals NSR at rate of 87 bpm.  Chest pain not reproducible with motion or palpation.  Exam unremarkable.  Patient with long-standing anxiety and noncompliant with medication.  No hx of CV disease. Suspect anxiety/stress as etiology.  Will obtain CBC, CMP, lipase, Troponin and CXR.  Resume Wellbutrin, taking as directed. Follow-up with PCP in 1 week if symptoms are not improving.  Follow-up sooner if indicated by workup.  Discussed alarm signs/symptoms with  patient.  Patient is aware when she needs to proceed to ER or call 911 if indicated. Patient voices understanding and agreement with plan.

## 2013-07-01 NOTE — Patient Instructions (Signed)
Please obtain labs.  Then proceed to the imaging department at [facility] [place].  I will call you with all your results.  Please take your medications as directed, including the Wellbutrin.  It is clear you are dealing with stress and anxiety.  Also give consideration to allowing [place] to increase your wellbutrin dose.  Follow-up will be scheduled based on your lab results.

## 2013-07-01 NOTE — Progress Notes (Signed)
Pre visit review using our clinic review tool, if applicable. No additional management support is needed unless otherwise documented below in the visit note/SLS  

## 2013-08-06 ENCOUNTER — Ambulatory Visit: Payer: 59 | Admitting: Family Medicine

## 2013-08-06 ENCOUNTER — Telehealth: Payer: Self-pay | Admitting: Family Medicine

## 2013-08-06 NOTE — Telephone Encounter (Signed)
Patient cancelled appointment for this evening. She states that she is feeling better.

## 2013-08-06 NOTE — Telephone Encounter (Signed)
FYI: This is a [facility] pt

## 2013-10-27 ENCOUNTER — Other Ambulatory Visit: Payer: Self-pay | Admitting: Family Medicine

## 2013-10-28 ENCOUNTER — Encounter: Payer: Self-pay | Admitting: Family Medicine

## 2013-10-28 ENCOUNTER — Ambulatory Visit (INDEPENDENT_AMBULATORY_CARE_PROVIDER_SITE_OTHER): Payer: 59 | Admitting: Family Medicine

## 2013-10-28 VITALS — BP 130/86 | HR 89 | Temp 99.5°F | Resp 16 | Wt 183.4 lb

## 2013-10-28 DIAGNOSIS — L0390 Cellulitis, unspecified: Principal | ICD-10-CM

## 2013-10-28 DIAGNOSIS — L0291 Cutaneous abscess, unspecified: Secondary | ICD-10-CM | POA: Insufficient documentation

## 2013-10-28 MED ORDER — DOXYCYCLINE HYCLATE 100 MG PO TABS: 100.0000 mg | ORAL_TABLET | Freq: Two times a day (BID) | ORAL | Status: DC

## 2013-10-28 MED ORDER — MUPIROCIN CALCIUM 2 % NA OINT: TOPICAL_OINTMENT | NASAL | Status: DC

## 2013-10-28 NOTE — Patient Instructions (Signed)
Follow up as needed Start the Doxycycline twice daily- take w/ food Hot soaks or compresses to the area Use the Mupirocin ointment in your nose to eradicate possible MRSA infection Start the wash w/ antibacterial soap Tylenol/ibuprofen for pain or fever REST! Call with any questions or concerns Hang in there!!

## 2013-10-28 NOTE — Telephone Encounter (Signed)
Med filled, appt today.

## 2013-10-28 NOTE — Assessment & Plan Note (Signed)
New.  Pt w/ obvious cellulitis and early abscess of R buttock.  No area to I&D.  Start oral abx.  Due to suspicion of MRSA will start intranasal bactroban.  Reviewed supportive care and red flags that should prompt return.  Pt expressed understanding and is in agreement w/ plan.

## 2013-10-28 NOTE — Progress Notes (Signed)
Pre visit review using our clinic review tool, if applicable. No additional management support is needed unless otherwise documented below in the visit note. 

## 2013-10-28 NOTE — Progress Notes (Signed)
   Subjective:    Patient ID: Angela Morris, female    DOB: [birth year], 43 y.o.   MRN: [id number]  HPI Boil- 'i have the worst boil over'.  R buttock.  Noted 6 days ago.  Area enlarged as the week went on.  Doing hot soaks, taking ibuprofen.  Pt thinks some of it drained last night.   Review of Systems For ROS see HPI     Objective:   Physical Exam  Vitals reviewed. Constitutional: She is oriented to person, place, and time. She appears well-developed and well-nourished. No distress.  HENT:  Head: Normocephalic and atraumatic.  Neurological: She is alert and oriented to person, place, and time.  Skin: Skin is warm and dry. There is erythema (Erythema and induration of R lower buttock, 5 cm circular area w/ central pore.  no obvious fluctuance or area to drain).          Assessment & Plan:

## 2013-10-31 ENCOUNTER — Telehealth: Payer: Self-pay | Admitting: Family Medicine

## 2013-10-31 ENCOUNTER — Other Ambulatory Visit: Payer: Self-pay | Admitting: General Practice

## 2013-10-31 MED ORDER — MUPIROCIN CALCIUM 2 % NA OINT: 1.0000 "application " | TOPICAL_OINTMENT | Freq: Two times a day (BID) | NASAL | Status: DC

## 2013-10-31 NOTE — Telephone Encounter (Signed)
Ok to substitute?

## 2013-10-31 NOTE — Telephone Encounter (Signed)
New medication prescribed

## 2013-10-31 NOTE — Telephone Encounter (Signed)
Caller name: Junie Panning Relation to pt: other / Pharmacy  Call back number: [phone] [facility]acy: [facility] [phone]   Reason for call:  mupirocin nasal ointment (BACTROBAN) 2 %  Is on back order, pharmacy recommending generic mupirocin ointment . Please advise

## 2013-11-24 ENCOUNTER — Other Ambulatory Visit: Payer: Self-pay | Admitting: Family Medicine

## 2013-11-25 NOTE — Telephone Encounter (Signed)
Med filled.  

## 2014-02-11 ENCOUNTER — Other Ambulatory Visit: Payer: Self-pay | Admitting: Family Medicine

## 2014-02-11 NOTE — Telephone Encounter (Signed)
Med filled.  

## 2014-03-03 ENCOUNTER — Other Ambulatory Visit: Payer: Self-pay

## 2014-03-03 DIAGNOSIS — Z1231 Encounter for screening mammogram for malignant neoplasm of breast: Secondary | ICD-10-CM

## 2014-03-10 ENCOUNTER — Encounter: Payer: Self-pay | Admitting: Family Medicine

## 2014-03-10 ENCOUNTER — Ambulatory Visit
Admission: RE | Admit: 2014-03-10 | Discharge: 2014-03-10 | Disposition: A | Discharge status: Home / Self Care | Payer: Self-pay | Source: Ambulatory Visit

## 2014-03-10 ENCOUNTER — Ambulatory Visit (INDEPENDENT_AMBULATORY_CARE_PROVIDER_SITE_OTHER): Payer: 59 | Admitting: Family Medicine

## 2014-03-10 VITALS — BP 128/76 | HR 93 | Temp 98.0°F | Resp 16 | Wt 182.0 lb

## 2014-03-10 DIAGNOSIS — E875 Hyperkalemia: Secondary | ICD-10-CM | POA: Insufficient documentation

## 2014-03-10 DIAGNOSIS — Z1231 Encounter for screening mammogram for malignant neoplasm of breast: Secondary | ICD-10-CM

## 2014-03-10 NOTE — Progress Notes (Signed)
Pre visit review using our clinic review tool, if applicable. No additional management support is needed unless otherwise documented below in the visit note. 

## 2014-03-10 NOTE — Patient Instructions (Signed)
Follow up as needed We'll notify you of your lab results and make any changes if needed Keep up the good work on healthy diet and regular exercise You look great! Call with any questions or concerns Happy Valentine's Day!!!

## 2014-03-10 NOTE — Progress Notes (Signed)
   Subjective:    Patient ID: Angela Morris, female    DOB: 1970-02-22, 44 y.o.   MRN: [id number]  HPI Elevated K+- pt reports she [facility] and they told her levels were elevated.  Pt did not bring results w/ her today.  Labs were done 2 months ago.  Pt reports intermittent muscle spasm and paresthesias.  Admits to poor water intake.   Review of Systems For ROS see HPI     Objective:   Physical Exam  Constitutional: She is oriented to person, place, and time. She appears well-developed and well-nourished. No distress.  HENT:  Head: Normocephalic and atraumatic.  Eyes: Conjunctivae and EOM are normal. Pupils are equal, round, and reactive to light.  Neck: Normal range of motion. Neck supple. No thyromegaly present.  Cardiovascular: Normal rate, regular rhythm, normal heart sounds and intact distal pulses.   No murmur heard. Pulmonary/Chest: Effort normal and breath sounds normal. No respiratory distress.  Abdominal: Soft. She exhibits no distension. There is no tenderness.  Musculoskeletal: She exhibits no edema.  Lymphadenopathy:    She has no cervical adenopathy.  Neurological: She is alert and oriented to person, place, and time.  Skin: Skin is warm and dry.  Psychiatric: She has a normal mood and affect. Her behavior is normal.  Vitals reviewed.         Assessment & Plan:

## 2014-03-10 NOTE — Assessment & Plan Note (Signed)
New.  This is per pt's report and I don't have any labs to review.  Will repeat labs today and determine if there is a need for additional w/u.  Pt expressed understanding and is in agreement w/ plan.

## 2014-03-11 ENCOUNTER — Telehealth: Payer: Self-pay | Admitting: Family Medicine

## 2014-03-11 LAB — MAGNESIUM: Magnesium: 2 mg/dL (ref 1.5–2.5)

## 2014-03-11 LAB — BASIC METABOLIC PANEL
BUN: 14 mg/dL (ref 6–23)
CALCIUM: 9.7 mg/dL (ref 8.4–10.5)
CO2: 31 mEq/L (ref 19–32)
Chloride: 99 mEq/L (ref 96–112)
Creatinine, Ser: 0.61 mg/dL (ref 0.40–1.20)
GFR: 137.16 mL/min (ref >60.00)
GLUCOSE: 77 mg/dL (ref 70–99)
Potassium: 3.2 mEq/L — ABNORMAL LOW (ref 3.5–5.1)
SODIUM: 137 meq/L (ref 135–145)

## 2014-03-11 NOTE — Telephone Encounter (Signed)
Caller name: Ghazal Relation to pt: self Call back number: [phone] Pharmacy:  Reason for call:   Requesting last lab results

## 2014-03-12 ENCOUNTER — Other Ambulatory Visit: Payer: Self-pay | Admitting: Family Medicine

## 2014-03-12 DIAGNOSIS — E876 Hypokalemia: Secondary | ICD-10-CM

## 2014-03-12 NOTE — Telephone Encounter (Signed)
Will notify pt when received.

## 2014-03-14 ENCOUNTER — Ambulatory Visit: Payer: Self-pay

## 2014-05-05 ENCOUNTER — Other Ambulatory Visit: Payer: Self-pay | Admitting: Family Medicine

## 2014-05-05 NOTE — Telephone Encounter (Signed)
Med filled.  

## 2014-09-04 ENCOUNTER — Encounter (HOSPITAL_BASED_OUTPATIENT_CLINIC_OR_DEPARTMENT_OTHER): Payer: Self-pay | Admitting: *Deleted

## 2014-09-04 ENCOUNTER — Emergency Department (HOSPITAL_BASED_OUTPATIENT_CLINIC_OR_DEPARTMENT_OTHER)
Admission: EM | Admit: 2014-09-04 | Discharge: 2014-09-04 | Disposition: A | Discharge status: Home / Self Care | Payer: 59 | Attending: Emergency Medicine | Admitting: Emergency Medicine

## 2014-09-04 DIAGNOSIS — W260XXA Contact with knife, initial encounter: Secondary | ICD-10-CM | POA: Diagnosis not present

## 2014-09-04 DIAGNOSIS — S61216A Laceration without foreign body of right little finger without damage to nail, initial encounter: Secondary | ICD-10-CM | POA: Insufficient documentation

## 2014-09-04 DIAGNOSIS — Z23 Encounter for immunization: Secondary | ICD-10-CM | POA: Diagnosis not present

## 2014-09-04 DIAGNOSIS — Y92000 Kitchen of unspecified non-institutional (private) residence as  the place of occurrence of the external cause: Secondary | ICD-10-CM | POA: Diagnosis not present

## 2014-09-04 DIAGNOSIS — H109 Unspecified conjunctivitis: Secondary | ICD-10-CM | POA: Diagnosis not present

## 2014-09-04 DIAGNOSIS — Z880 Allergy status to penicillin: Secondary | ICD-10-CM | POA: Diagnosis not present

## 2014-09-04 DIAGNOSIS — I10 Essential (primary) hypertension: Secondary | ICD-10-CM | POA: Insufficient documentation

## 2014-09-04 DIAGNOSIS — Y998 Other external cause status: Secondary | ICD-10-CM | POA: Insufficient documentation

## 2014-09-04 DIAGNOSIS — Y9389 Activity, other specified: Secondary | ICD-10-CM | POA: Insufficient documentation

## 2014-09-04 DIAGNOSIS — F419 Anxiety disorder, unspecified: Secondary | ICD-10-CM | POA: Insufficient documentation

## 2014-09-04 DIAGNOSIS — S61311A Laceration without foreign body of left index finger with damage to nail, initial encounter: Secondary | ICD-10-CM

## 2014-09-04 DIAGNOSIS — F329 Major depressive disorder, single episode, unspecified: Secondary | ICD-10-CM | POA: Diagnosis not present

## 2014-09-04 DIAGNOSIS — Z79899 Other long term (current) drug therapy: Secondary | ICD-10-CM | POA: Diagnosis not present

## 2014-09-04 MED ORDER — TETANUS-DIPHTH-ACELL PERTUSSIS 5-2.5-18.5 LF-MCG/0.5 IM SUSP
0.5000 mL | Freq: Once | INTRAMUSCULAR | Status: AC
  Administered 2014-09-04: 0.5 mL via INTRAMUSCULAR

## 2014-09-04 MED ORDER — CIPROFLOXACIN HCL 0.3 % OP OINT: TOPICAL_OINTMENT | Freq: Two times a day (BID) | OPHTHALMIC | Status: DC

## 2014-09-04 MED ORDER — LIDOCAINE HCL (PF) 1 % IJ SOLN
INTRAMUSCULAR | Status: AC
  Filled 2014-09-04: qty 5

## 2014-09-04 MED ORDER — TETANUS-DIPHTH-ACELL PERTUSSIS 5-2.5-18.5 LF-MCG/0.5 IM SUSP
INTRAMUSCULAR | Status: DC
Start: 2014-09-04 — End: 2014-09-05
  Filled 2014-09-04: qty 0.5

## 2014-09-04 NOTE — Discharge Instructions (Signed)
Use antibiotics eyedrops as directed for 5 days.  Bacterial Conjunctivitis Bacterial conjunctivitis, commonly called pink eye, is an inflammation of the clear membrane that covers the white part of the eye (conjunctiva). The inflammation can also happen on the underside of the eyelids. The blood vessels in the conjunctiva become inflamed, causing the eye to become red or pink. Bacterial conjunctivitis may spread easily from one eye to another and from person to person (contagious).  CAUSES  Bacterial conjunctivitis is caused by bacteria. The bacteria may come from your own skin, your upper respiratory tract, or from someone else with bacterial conjunctivitis. SYMPTOMS  The normally white color of the eye or the underside of the eyelid is usually pink or red. The pink eye is usually associated with irritation, tearing, and some sensitivity to light. Bacterial conjunctivitis is often associated with a thick, yellowish discharge from the eye. The discharge may turn into a crust on the eyelids overnight, which causes your eyelids to stick together. If a discharge is present, there may also be some blurred vision in the affected eye. DIAGNOSIS  Bacterial conjunctivitis is diagnosed by your caregiver through an eye exam and the symptoms that you report. Your caregiver looks for changes in the surface tissues of your eyes, which may point to the specific type of conjunctivitis. A sample of any discharge may be collected on a cotton-tip swab if you have a severe case of conjunctivitis, if your cornea is affected, or if you keep getting repeat infections that do not respond to treatment. The sample will be sent to a lab to see if the inflammation is caused by a bacterial infection and to see if the infection will respond to antibiotic medicines. TREATMENT   Bacterial conjunctivitis is treated with antibiotics. Antibiotic eyedrops are most often used. However, antibiotic ointments are also available. Antibiotics  pills are sometimes used. Artificial tears or eye washes may ease discomfort. HOME CARE INSTRUCTIONS   To ease discomfort, apply a cool, clean washcloth to your eye for 10-20 minutes, 3-4 times a day.  Gently wipe away any drainage from your eye with a warm, wet washcloth or a cotton ball.  Wash your hands often with soap and water. Use paper towels to dry your hands.  Do not share towels or washcloths. This may spread the infection.  Change or wash your pillowcase every day.  You should not use eye makeup until the infection is gone.  Do not operate machinery or drive if your vision is blurred.  Stop using contact lenses. Ask your caregiver how to sterilize or replace your contacts before using them again. This depends on the type of contact lenses that you use.  When applying medicine to the infected eye, do not touch the edge of your eyelid with the eyedrop bottle or ointment tube. SEEK IMMEDIATE MEDICAL CARE IF:   Your infection has not improved within 3 days after beginning treatment.  You had yellow discharge from your eye and it returns.  You have increased eye pain.  Your eye redness is spreading.  Your vision becomes blurred.  You have a fever or persistent symptoms for more than 2-3 days.  You have a fever and your symptoms suddenly get worse.  You have facial pain, redness, or swelling. MAKE SURE YOU:   Understand these instructions.  Will watch your condition.  Will get help right away if you are not doing well or get worse. Document Released: 01/17/2005 Document Revised: 06/03/2013 Document Reviewed: 06/20/2011 [employer] Patient Information  2015 [employer]. This information is not intended to replace advice given to you by your health care provider. Make sure you discuss any questions you have with your health care provider.  Laceration Care, Adult A laceration is a cut or lesion that goes through all layers of the skin and into the tissue just beneath  the skin. TREATMENT  Some lacerations may not require closure. Some lacerations may not be able to be closed due to an increased risk of infection. It is important to see your caregiver as soon as possible after an injury to minimize the risk of infection and maximize the opportunity for successful closure. If closure is appropriate, pain medicines may be given, if needed. The wound will be cleaned to help prevent infection. Your caregiver will use stitches (sutures), staples, wound glue (adhesive), or skin adhesive strips to repair the laceration. These tools bring the skin edges together to allow for faster healing and a better cosmetic outcome. However, all wounds will heal with a scar. Once the wound has healed, scarring can be minimized by covering the wound with sunscreen during the day for 1 full year. HOME CARE INSTRUCTIONS  For sutures or staples:  Keep the wound clean and dry.  If you were given a bandage (dressing), you should change it at least once a day. Also, change the dressing if it becomes wet or dirty, or as directed by your caregiver.  Wash the wound with soap and water 2 times a day. Rinse the wound off with water to remove all soap. Pat the wound dry with a clean towel.  After cleaning, apply a thin layer of the antibiotic ointment as recommended by your caregiver. This will help prevent infection and keep the dressing from sticking.  You may shower as usual after the first 24 hours. Do not soak the wound in water until the sutures are removed.  Only take over-the-counter or prescription medicines for pain, discomfort, or fever as directed by your caregiver.  Get your sutures or staples removed as directed by your caregiver. For skin adhesive strips:  Keep the wound clean and dry.  Do not get the skin adhesive strips wet. You may bathe carefully, using caution to keep the wound dry.  If the wound gets wet, pat it dry with a clean towel.  Skin adhesive strips will  fall off on their own. You may trim the strips as the wound heals. Do not remove skin adhesive strips that are still stuck to the wound. They will fall off in time. For wound adhesive:  You may briefly wet your wound in the shower or bath. Do not soak or scrub the wound. Do not swim. Avoid periods of heavy perspiration until the skin adhesive has fallen off on its own. After showering or bathing, gently pat the wound dry with a clean towel.  Do not apply liquid medicine, cream medicine, or ointment medicine to your wound while the skin adhesive is in place. This may loosen the film before your wound is healed.  If a dressing is placed over the wound, be careful not to apply tape directly over the skin adhesive. This may cause the adhesive to be pulled off before the wound is healed.  Avoid prolonged exposure to sunlight or tanning lamps while the skin adhesive is in place. Exposure to ultraviolet light in the first year will darken the scar.  The skin adhesive will usually remain in place for 5 to 10 days, then naturally fall off  the skin. Do not pick at the adhesive film. You may need a tetanus shot if:  You cannot remember when you had your last tetanus shot.  You have never had a tetanus shot. If you get a tetanus shot, your arm may swell, get red, and feel warm to the touch. This is common and not a problem. If you need a tetanus shot and you choose not to have one, there is a rare chance of getting tetanus. Sickness from tetanus can be serious. SEEK MEDICAL CARE IF:   You have redness, swelling, or increasing pain in the wound.  You see a red line that goes away from the wound.  You have yellowish-white fluid (pus) coming from the wound.  You have a fever.  You notice a bad smell coming from the wound or dressing.  Your wound breaks open before or after sutures have been removed.  You notice something coming out of the wound such as wood or glass.  Your wound is on your hand or  foot and you cannot move a finger or toe. SEEK IMMEDIATE MEDICAL CARE IF:   Your pain is not controlled with prescribed medicine.  You have severe swelling around the wound causing pain and numbness or a change in color in your arm, hand, leg, or foot.  Your wound splits open and starts bleeding.  You have worsening numbness, weakness, or loss of function of any joint around or beyond the wound.  You develop painful lumps near the wound or on the skin anywhere on your body. MAKE SURE YOU:   Understand these instructions.  Will watch your condition.  Will get help right away if you are not doing well or get worse. Document Released: 01/17/2005 Document Revised: 04/11/2011 Document Reviewed: 07/13/2010 [facility] Patient Information 2015 [place], [place]. This information is not intended to replace advice given to you by your health care provider. Make sure you discuss any questions you have with your health care provider.  Sutured Wound Care Sutures are stitches that can be used to close wounds. Wound care helps prevent pain and infection.  HOME CARE INSTRUCTIONS   Rest and elevate the injured area until all the pain and swelling are gone.  Only take over-the-counter or prescription medicines for pain, discomfort, or fever as directed by your caregiver.  After 48 hours, gently wash the area with mild soap and water once a day, or as directed. Rinse off the soap. Pat the area dry with a clean towel. Do not rub the wound. This may cause bleeding.  Follow your caregiver's instructions for how often to change the bandage (dressing). Stop using a dressing after 2 days or after the wound stops draining.  If the dressing sticks, moisten it with soapy water and gently remove it.  Apply ointment on the wound as directed.  Avoid stretching a sutured wound.  Drink enough fluids to keep your urine clear or pale yellow.  Follow up with your caregiver for suture removal as directed.  Use  sunscreen on your wound for the next 3 to 6 months so the scar will not darken. SEEK IMMEDIATE MEDICAL CARE IF:   Your wound becomes red, swollen, hot, or tender.  You have increasing pain in the wound.  You have a red streak that extends from the wound.  There is pus coming from the wound.  You have a fever.  You have shaking chills.  There is a bad smell coming from the wound.  You have persistent  bleeding from the wound. MAKE SURE YOU:   Understand these instructions.  Will watch your condition.  Will get help right away if you are not doing well or get worse. Document Released: 02/25/2004 Document Revised: 04/11/2011 Document Reviewed: 05/23/2010 [facility] Patient Information 2015 [place], [place]. This information is not intended to replace advice given to you by your health care provider. Make sure you discuss any questions you have with your health care provider.

## 2014-09-04 NOTE — ED Notes (Signed)
Left little finger laceration with a kitchen knife tonight.

## 2014-09-04 NOTE — ED Provider Notes (Signed)
CSN: [id number]     Arrival date & time 09/04/14  2011 History   First MD Initiated Contact with Patient 09/04/14 2042     Chief Complaint  Patient presents with  . Extremity Laceration     (Consider location/radiation/quality/duration/timing/severity/associated sxs/prior Treatment) HPI Comments: 44 year old female presenting with laceration to her left pinky finger occurring about 25 minutes prior to arrival with a kitchen knife. She accidentally cut her finger. No numbness or tingling. Denies difficulty moving her finger. Also complaining of right eye redness that gradually developed this morning. States once she noticed the redness she took her contact out. Denies any eye pain, states it is itchy.  Patient is a 44 y.o. female presenting with skin laceration. The history is provided by the patient.  Laceration Location:  Finger Finger laceration location:  L little finger Length (cm):  1.5 Depth:  Through dermis Quality: straight   Bleeding: controlled   Time since incident:  25 minutes Laceration mechanism:  Knife Pain details:    Quality:  Throbbing   Severity:  Mild   Progression:  Unchanged Foreign body present:  No foreign bodies Relieved by:  None tried Worsened by:  Nothing tried Ineffective treatments:  None tried Tetanus status:  Out of date   Past Medical History  Diagnosis Date  . Anxiety   . Depression   . Hypertension    Past Surgical History  Procedure Laterality Date  . Tubal ligation  2002   Family History  Problem Relation Age of Onset  . Hypertension Mother   . Hypertension Father   . Stroke Father   . Coronary artery disease      female relative <50   History  Substance Use Topics  . Smoking status: Never Smoker   . Smokeless tobacco: Not on file  . Alcohol Use: Yes     Comment: social   OB History    No data available     Review of Systems  Constitutional: Negative for fever.  Eyes: Positive for redness.  Skin: Positive for wound.   Neurological: Negative for numbness.  All other systems reviewed and are negative.     Allergies  Penicillins and Sulfonamide derivatives  Home Medications   Prior to Admission medications   Medication Sig Start Date End Date Taking? Authorizing Provider  buPROPion (WELLBUTRIN XL) 150 MG 24 hr tablet Take 1 tablet (150 mg total) by mouth daily. 01/07/13   Midge Minium, MD  ciprofloxacin (CILOXAN) 0.3 % ophthalmic ointment Place into the right eye 2 (two) times daily. 09/04/14   Carman Ching, PA-C  hydrochlorothiazide (HYDRODIURIL) 25 MG tablet TAKE 1 TABLET BY MOUTH DAILY 05/05/14   Midge Minium, MD   BP 131/68 mmHg  Pulse 80  Temp(Src) 98.4 F (36.9 C) (Oral)  Resp 16  Ht 5\' 4"  (1.626 m)  Wt 170 lb (77.111 kg)  BMI 29.17 kg/m2  SpO2 100%  LMP 09/04/2014 Physical Exam  Constitutional: She is oriented to person, place, and time. She appears well-developed and well-nourished. No distress.  HENT:  Head: Normocephalic and atraumatic.  Mouth/Throat: Oropharynx is clear and moist.  Eyes: EOM are normal. Pupils are equal, round, and reactive to light. No foreign body present in the right eye. Right conjunctiva is injected.  Slit lamp exam:      The right eye shows no corneal ulcer.  Neck: Normal range of motion. Neck supple.  Cardiovascular: Normal rate, regular rhythm and normal heart sounds.   Pulmonary/Chest: Effort normal  and breath sounds normal. No respiratory distress.  Musculoskeletal: Normal range of motion. She exhibits no edema.  1.5 cm laceration on dorsal aspect of left pinky finger between the MCP and PIP. No active bleeding. Able to fully flex and extend pinky finger at MCP, PIP and DIP.  Cap refill less than 3 seconds. Sensation intact.  Neurological: She is alert and oriented to person, place, and time. No sensory deficit.  Skin: Skin is warm and dry.  Psychiatric: She has a normal mood and affect. Her behavior is normal.  Nursing note and vitals  reviewed.   ED Course  Procedures (including critical care time) LACERATION REPAIR Performed by: Lucien Mons, Tonye Pearson, PA-S Authorized by: Lucien Mons Consent: Verbal consent obtained. Risks and benefits: risks, benefits and alternatives were discussed Consent given by: patient Patient identity confirmed: provided demographic data Prepped and Draped in normal sterile fashion Wound explored  Laceration Location: left pinky finger  Laceration Length: 1.5 cm  No Foreign Bodies seen or palpated  Anesthesia: local infiltration  Local anesthetic: lidocaine 1% without epinephrine  Anesthetic total: 5 ml  Irrigation method: syringe Amount of cleaning: standard  Skin closure: 5-0 prolene  Number of sutures: 4  Technique: simple interrupted  Patient tolerance: Patient tolerated the procedure well with no immediate complications.  Labs Review Labs Reviewed - No data to display  Imaging Review No results found.   EKG Interpretation None      MDM   Final diagnoses:  Laceration of left index finger w/o foreign body with damage to nail, initial encounter  Right conjunctivitis   Neurovascularly intact. No evidence of tendon disruption. Laceration repaired. Wound care given. Tetanus updated. F/u PCP 7 days for suture removal. Regarding her eye, there is no associated pain, just redness and itching. She wears contacts. Appears to be conjunctivitis. No corneal ulcer. Will treat with Ciloxan eye drops. Advised her to not use contacts until she follows up with her PCP or ophthalmologist. Stable for discharge. Return precautions given. Patient states understanding of treatment care plan and is agreeable.    Carman Ching, PA-C 09/04/14 2203  Veryl Speak, MD 09/04/14 [phone]

## 2014-09-04 NOTE — ED Notes (Signed)
Nurse first-lac noted to left index finger-bleeding controlled-gauze/tape dsg applied

## 2014-09-20 ENCOUNTER — Encounter: Payer: Self-pay | Admitting: Family Medicine

## 2014-09-20 ENCOUNTER — Ambulatory Visit (INDEPENDENT_AMBULATORY_CARE_PROVIDER_SITE_OTHER): Payer: 59 | Admitting: Family Medicine

## 2014-09-20 VITALS — BP 120/78 | HR 80 | Temp 98.4°F | Ht 64.0 in | Wt 181.5 lb

## 2014-09-20 DIAGNOSIS — R319 Hematuria, unspecified: Secondary | ICD-10-CM

## 2014-09-20 DIAGNOSIS — R8290 Unspecified abnormal findings in urine: Secondary | ICD-10-CM | POA: Diagnosis not present

## 2014-09-20 DIAGNOSIS — S61219D Laceration without foreign body of unspecified finger without damage to nail, subsequent encounter: Secondary | ICD-10-CM

## 2014-09-20 DIAGNOSIS — N3941 Urge incontinence: Secondary | ICD-10-CM | POA: Diagnosis not present

## 2014-09-20 DIAGNOSIS — N3000 Acute cystitis without hematuria: Secondary | ICD-10-CM | POA: Diagnosis not present

## 2014-09-20 LAB — POCT URINALYSIS DIPSTICK
Bilirubin, UA: NEGATIVE
GLUCOSE UA: NEGATIVE
Ketones, UA: NEGATIVE
Nitrite, UA: NEGATIVE
PH UA: 6.5
PROTEIN UA: POSITIVE
RBC UA: NEGATIVE
SPEC GRAV UA: 1.02
UROBILINOGEN UA: 0.2

## 2014-09-20 MED ORDER — CIPROFLOXACIN HCL 500 MG PO TABS: 500.0000 mg | ORAL_TABLET | Freq: Two times a day (BID) | ORAL | Status: DC

## 2014-09-20 NOTE — Progress Notes (Signed)
Pre visit review using our clinic review tool, if applicable. No additional management support is needed unless otherwise documented below in the visit note. 

## 2014-09-20 NOTE — Progress Notes (Signed)
   Subjective:    Patient ID: Angela Morris, female    DOB: [birth year], 44 y.o.   MRN: [id number]  HPI Here for several things. First she has had UTI sx for the past 6 days including burning and urgency. No fever or back pain or nausea. Drinking plenty of water and eating lots of yogurt. Also she had sutures placed on the left 5th finger on 09-04-14 in the ER after she cut the finger with a knife. Since then 3 sutures came out and one remains. The finger feels fine now.   Review of Systems  Constitutional: Negative.   Respiratory: Negative.   Cardiovascular: Negative.   Gastrointestinal: Negative.   Genitourinary: Positive for dysuria, urgency and frequency. Negative for hematuria, flank pain, vaginal discharge and pelvic pain.  Skin: Positive for wound.       Objective:   Physical Exam  Constitutional: She appears well-developed and well-nourished.  Pulmonary/Chest: Effort normal and breath sounds normal.  Abdominal: Soft. Bowel sounds are normal.  Skin:  The left 5th finger has a healing laceration with one suture present          Assessment & Plan:  Treat the UTI with Cipro. The suture was removed.

## 2014-11-03 ENCOUNTER — Encounter: Payer: Self-pay | Admitting: Family Medicine

## 2014-11-03 ENCOUNTER — Encounter (INDEPENDENT_AMBULATORY_CARE_PROVIDER_SITE_OTHER): Payer: Self-pay

## 2014-11-03 ENCOUNTER — Ambulatory Visit (INDEPENDENT_AMBULATORY_CARE_PROVIDER_SITE_OTHER): Payer: 59 | Admitting: Family Medicine

## 2014-11-03 ENCOUNTER — Other Ambulatory Visit (HOSPITAL_COMMUNITY)
Admission: RE | Admit: 2014-11-03 | Discharge: 2014-11-03 | Disposition: A | Discharge status: Home / Self Care | Payer: 59 | Source: Ambulatory Visit | Attending: Family Medicine | Admitting: Family Medicine

## 2014-11-03 VITALS — BP 130/92 | HR 92 | Temp 98.3°F | Ht 64.0 in | Wt 179.6 lb

## 2014-11-03 DIAGNOSIS — N390 Urinary tract infection, site not specified: Secondary | ICD-10-CM

## 2014-11-03 DIAGNOSIS — N898 Other specified noninflammatory disorders of vagina: Secondary | ICD-10-CM | POA: Insufficient documentation

## 2014-11-03 DIAGNOSIS — F419 Anxiety disorder, unspecified: Secondary | ICD-10-CM | POA: Diagnosis not present

## 2014-11-03 DIAGNOSIS — N760 Acute vaginitis: Secondary | ICD-10-CM | POA: Diagnosis not present

## 2014-11-03 DIAGNOSIS — R82998 Other abnormal findings in urine: Secondary | ICD-10-CM

## 2014-11-03 LAB — POCT URINALYSIS DIPSTICK
Bilirubin, UA: NEGATIVE
Blood, UA: NEGATIVE
GLUCOSE UA: NEGATIVE
Ketones, UA: NEGATIVE
NITRITE UA: NEGATIVE
PROTEIN UA: NEGATIVE
Spec Grav, UA: 1.015
Urobilinogen, UA: 0.2
pH, UA: 6.5

## 2014-11-03 MED ORDER — LORAZEPAM 0.5 MG PO TABS: ORAL_TABLET | ORAL | Status: DC

## 2014-11-03 NOTE — Progress Notes (Signed)
Pre visit review using our clinic review tool, if applicable. No additional management support is needed unless otherwise documented below in the visit note. 

## 2014-11-03 NOTE — Patient Instructions (Signed)
Follow up as scheduled for your physical We'll notify you of your lab results and make any changes if needed Use the Ativan as needed for anxiety w/ flying Call with any questions or concerns If you want to join [place] at the new [place] office, any scheduled appointments will automatically transfer and we will see you at [address] [place] [address] [place], [place] [address]  Happy Fall!!!

## 2014-11-03 NOTE — Progress Notes (Signed)
   Subjective:    Patient ID: Angela Morris, female    DOB: 1970-08-19, 44 y.o.   MRN: [id number]  HPI Vaginal d/c- [facility] and was treated for UTI w/ Cipro.  Continues to have odor, no itching.  D/c is 'thin and watery and smells like garbage'.  No pain.  No concerns for infxn but new partner.  Fear of flying- has 2 trips scheduled for November.  Has previous used Ativan as needed for her anxiety.  Asking for refill.   Review of Systems For ROS see HPI     Objective:   Physical Exam  Constitutional: She is oriented to person, place, and time. She appears well-developed and well-nourished. No distress.  HENT:  Head: Normocephalic and atraumatic.  Genitourinary: Vaginal discharge (scant, watery, foul smelling) found.  Labia WNL bilaterally Introitus WNL + foul odor  Neurological: She is alert and oriented to person, place, and time.  Skin: Skin is warm and dry.  Psychiatric: Her behavior is normal. Thought content normal.  anxious  Vitals reviewed.         Assessment & Plan:

## 2014-11-04 ENCOUNTER — Telehealth: Payer: Self-pay | Admitting: Family Medicine

## 2014-11-04 LAB — URINE CULTURE

## 2014-11-04 NOTE — Assessment & Plan Note (Signed)
New.  Pt's watery discharge and foul odor are more consistent w/ BV but given new sex partner will also get GC/CT to r/o more serious infxn.  Will await wet prep results prior to treating.  Pt expressed understanding and is in agreement w/ plan.

## 2014-11-04 NOTE — Telephone Encounter (Signed)
Relation to NG:EXBM Call back [phone]  Reason for call:  Patient returning your call regarding lab results

## 2014-11-04 NOTE — Assessment & Plan Note (Signed)
Pt has upcoming travel planned and she becomes very anxious when flying.  Has used Ativan previously.  Refill provided.

## 2014-11-05 ENCOUNTER — Other Ambulatory Visit: Payer: Self-pay | Admitting: Family Medicine

## 2014-11-05 ENCOUNTER — Other Ambulatory Visit (INDEPENDENT_AMBULATORY_CARE_PROVIDER_SITE_OTHER): Payer: 59

## 2014-11-05 ENCOUNTER — Telehealth: Payer: Self-pay | Admitting: *Deleted

## 2014-11-05 DIAGNOSIS — N898 Other specified noninflammatory disorders of vagina: Secondary | ICD-10-CM | POA: Diagnosis not present

## 2014-11-05 NOTE — Telephone Encounter (Signed)
Please have pt return for urine

## 2014-11-05 NOTE — Telephone Encounter (Signed)
Pt scheduled for this afternoon.

## 2014-11-05 NOTE — Telephone Encounter (Signed)
Received call from [facility] at [facility] to let us know that they can not add a GC/Chlamydia to the Affirm that was sent in on (11-03-14).   They can run it off a urine.  Please advise.//AB/CMA

## 2014-11-05 NOTE — Telephone Encounter (Signed)
Pt informed of results.

## 2014-11-06 ENCOUNTER — Other Ambulatory Visit: Payer: Self-pay | Admitting: General Practice

## 2014-11-06 LAB — CERVICOVAGINAL ANCILLARY ONLY: Wet Prep (BD Affirm): POSITIVE — AB

## 2014-11-06 MED ORDER — METRONIDAZOLE 500 MG PO TABS: 500.0000 mg | ORAL_TABLET | Freq: Two times a day (BID) | ORAL | Status: DC

## 2014-11-07 LAB — GC/CHLAMYDIA PROBE AMP, URINE
Chlamydia, Swab/Urine, PCR: NEGATIVE
GC Probe Amp, Urine: NEGATIVE

## 2015-01-07 ENCOUNTER — Telehealth: Payer: Self-pay

## 2015-01-07 NOTE — Telephone Encounter (Signed)
Pre-Visit Call completed. 

## 2015-01-07 NOTE — Telephone Encounter (Signed)
Patient states she will call back,on another line at this time.

## 2015-01-08 ENCOUNTER — Ambulatory Visit (INDEPENDENT_AMBULATORY_CARE_PROVIDER_SITE_OTHER): Payer: 59 | Admitting: Family Medicine

## 2015-01-08 ENCOUNTER — Other Ambulatory Visit: Payer: Self-pay | Admitting: General Practice

## 2015-01-08 ENCOUNTER — Other Ambulatory Visit (HOSPITAL_COMMUNITY)
Admission: RE | Admit: 2015-01-08 | Discharge: 2015-01-08 | Disposition: A | Discharge status: Home / Self Care | Payer: 59 | Source: Ambulatory Visit | Attending: Family Medicine | Admitting: Family Medicine

## 2015-01-08 ENCOUNTER — Encounter: Payer: Self-pay | Admitting: Family Medicine

## 2015-01-08 VITALS — BP 132/86 | HR 99 | Temp 98.1°F | Resp 17 | Ht 64.0 in | Wt 179.4 lb

## 2015-01-08 DIAGNOSIS — Z124 Encounter for screening for malignant neoplasm of cervix: Secondary | ICD-10-CM

## 2015-01-08 DIAGNOSIS — J0100 Acute maxillary sinusitis, unspecified: Secondary | ICD-10-CM | POA: Diagnosis not present

## 2015-01-08 DIAGNOSIS — Z1151 Encounter for screening for human papillomavirus (HPV): Secondary | ICD-10-CM | POA: Insufficient documentation

## 2015-01-08 DIAGNOSIS — Z0000 Encounter for general adult medical examination without abnormal findings: Secondary | ICD-10-CM | POA: Diagnosis not present

## 2015-01-08 DIAGNOSIS — Z01419 Encounter for gynecological examination (general) (routine) without abnormal findings: Secondary | ICD-10-CM | POA: Insufficient documentation

## 2015-01-08 MED ORDER — PROMETHAZINE-DM 6.25-15 MG/5ML PO SYRP: 5.0000 mL | ORAL_SOLUTION | Freq: Four times a day (QID) | ORAL | Status: DC | PRN

## 2015-01-08 MED ORDER — CLARITHROMYCIN 500 MG PO TABS: 500.0000 mg | ORAL_TABLET | Freq: Two times a day (BID) | ORAL | Status: DC

## 2015-01-08 NOTE — Progress Notes (Signed)
Pre visit review using our clinic review tool, if applicable. No additional management support is needed unless otherwise documented below in the visit note. 

## 2015-01-08 NOTE — Progress Notes (Signed)
   Subjective:    Patient ID: Angela Morris, female    DOB: May 19, 1970, 44 y.o.   MRN: [id number]  HPI CPE- UTD on mammo.  Due for pap.    URI- sxs started Saturday w/ nasal congestion and cough.  Yesterday developed ear pain, sinus pressure.  Mild diarrhea.  Subjective fevers at night.  + laryngitis.  + sick contacts.   Review of Systems Patient reports no vision/ hearing changes, adenopathy,fever, weight change,  persistant/recurrent hoarseness , swallowing issues, chest pain, palpitations, edema, persistant/recurrent cough, hemoptysis, dyspnea (rest/exertional/paroxysmal nocturnal), gastrointestinal bleeding (melena, rectal bleeding), abdominal pain, significant heartburn, bowel changes, GU symptoms (dysuria, hematuria, incontinence), Gyn symptoms (abnormal  bleeding, pain),  syncope, focal weakness, memory loss, numbness & tingling, skin/hair/nail changes, abnormal bruising or bleeding, anxiety, or depression.     Objective:   Physical Exam  General Appearance:    Alert, cooperative, no distress, appears stated age  Head:    Normocephalic, without obvious abnormality, atraumatic  Eyes:    PERRL, conjunctiva/corneas clear, EOM's intact, fundi    benign, both eyes  Ears:    TM's retracted bilaterally and normal external ear canals, both ears  Nose:   Edematous nasal turbinates, TTP over frontal and maxillary sinuses  Throat:   Lips, mucosa, and tongue normal; teeth and gums normal  Neck:   Supple, symmetrical, trachea midline, no adenopathy;    Thyroid: no enlargement/tenderness/nodules  Back:     Symmetric, no curvature, ROM normal, no CVA tenderness  Lungs:     Clear to auscultation bilaterally, respirations unlabored  Chest Wall:    No tenderness or deformity   Heart:    Regular rate and rhythm, S1 and S2 normal, no murmur, rub   or gallop  Breast Exam:    No tenderness, masses, or nipple abnormality  Abdomen:     Soft, non-tender, bowel sounds active all four quadrants,    no  masses, no organomegaly  Genitalia:    External genitalia normal, cervix normal in appearance, no CMT, uterine fibroids present, adnexa w/out mass or tenderness, mucosa pink and moist, no lesions or discharge present  Rectal:    Normal external appearance  Extremities:   Extremities normal, atraumatic, no cyanosis or edema  Pulses:   2+ and symmetric all extremities  Skin:   Skin color, texture, turgor normal, no rashes or lesions  Lymph nodes:   Cervical, supraclavicular, and axillary nodes normal  Neurologic:   CNII-XII intact, normal strength, sensation and reflexes    throughout          Assessment & Plan:

## 2015-01-08 NOTE — Assessment & Plan Note (Signed)
Pt's sxs and PE consistent w/ infxn.  Start abx.  Reviewed supportive care and red flags that should prompt return.  Pt expressed understanding and is in agreement w/ plan.  

## 2015-01-08 NOTE — Assessment & Plan Note (Signed)
Pap collected. 

## 2015-01-08 NOTE — Patient Instructions (Signed)
Follow up in 6 months to recheck BP We'll notify you of your lab results and make any changes if needed Keep up the good work on healthy diet and regular exercise- you look great! Start the Biaxin twice daily- take w/ food- for the sinus infection Drink plenty of fluids REST! Call with any questions or concerns If you want to join [place] at the new [place] office, any scheduled appointments will automatically transfer and we will see you at [address] [place] [address] [place], [place] [address] (OPENING 02/03/15) Happy Holidays!!!

## 2015-01-08 NOTE — Assessment & Plan Note (Signed)
Pt's PE WNL w/ exception of sinus infxn and known uterine fibroids.  UTD on mammogram.  Pap done today.  Check labs.  Anticipatory guidance provided.

## 2015-01-09 LAB — CBC WITH DIFFERENTIAL/PLATELET
Basophils Absolute: 0 10*3/uL (ref 0.0–0.1)
Basophils Relative: 0.2 % (ref 0.0–3.0)
Eosinophils Absolute: 0.3 10*3/uL (ref 0.0–0.7)
Eosinophils Relative: 2.7 % (ref 0.0–5.0)
HEMATOCRIT: 42.4 % (ref 36.0–46.0)
HEMOGLOBIN: 13.5 g/dL (ref 12.0–15.0)
Lymphocytes Relative: 15.5 % (ref 12.0–46.0)
Lymphs Abs: 1.9 10*3/uL (ref 0.7–4.0)
MCHC: 31.9 g/dL (ref 30.0–36.0)
MCV: 82 fl (ref 78.0–100.0)
Monocytes Absolute: 1 10*3/uL (ref 0.1–1.0)
Monocytes Relative: 8.3 % (ref 3.0–12.0)
Neutro Abs: 9.1 10*3/uL — ABNORMAL HIGH (ref 1.4–7.7)
Neutrophils Relative %: 73.3 % (ref 43.0–77.0)
Platelets: 328 10*3/uL (ref 150.0–400.0)
RBC: 5.17 Mil/uL — ABNORMAL HIGH (ref 3.87–5.11)
RDW: 14.6 % (ref 11.5–15.5)
WBC: 12.4 10*3/uL — ABNORMAL HIGH (ref 4.0–10.5)

## 2015-01-09 LAB — LIPID PANEL
Cholesterol: 194 mg/dL (ref 0–200)
HDL: 85.1 mg/dL (ref >39.00)
LDL CALC: 96 mg/dL (ref 0–99)
NonHDL: 108.93
TRIGLYCERIDES: 66 mg/dL (ref 0.0–149.0)
Total CHOL/HDL Ratio: 2
VLDL: 13.2 mg/dL (ref 0.0–40.0)

## 2015-01-09 LAB — TSH: TSH: 1.34 u[IU]/mL (ref 0.35–4.50)

## 2015-01-09 LAB — BASIC METABOLIC PANEL
BUN: 9 mg/dL (ref 6–23)
CO2: 28 meq/L (ref 19–32)
CREATININE: 0.63 mg/dL (ref 0.40–1.20)
Calcium: 9.8 mg/dL (ref 8.4–10.5)
Chloride: 100 mEq/L (ref 96–112)
GFR: 131.65 mL/min (ref >60.00)
GLUCOSE: 83 mg/dL (ref 70–99)
Potassium: 3.7 mEq/L (ref 3.5–5.1)
SODIUM: 138 meq/L (ref 135–145)

## 2015-01-09 LAB — HEPATIC FUNCTION PANEL
ALT: 13 U/L (ref 0–35)
AST: 18 U/L (ref 0–37)
Albumin: 4.5 g/dL (ref 3.5–5.2)
Alkaline Phosphatase: 46 U/L (ref 39–117)
BILIRUBIN TOTAL: 0.6 mg/dL (ref 0.2–1.2)
Bilirubin, Direct: 0 mg/dL (ref 0.0–0.3)
Total Protein: 8.4 g/dL — ABNORMAL HIGH (ref 6.0–8.3)

## 2015-01-09 LAB — VITAMIN D 25 HYDROXY (VIT D DEFICIENCY, FRACTURES): VITD: 10.33 ng/mL — ABNORMAL LOW (ref 30.00–100.00)

## 2015-01-12 ENCOUNTER — Other Ambulatory Visit: Payer: Self-pay | Admitting: General Practice

## 2015-01-12 MED ORDER — VITAMIN D (ERGOCALCIFEROL) 1.25 MG (50000 UNIT) PO CAPS: 50000.0000 [IU] | ORAL_CAPSULE | ORAL | Status: DC

## 2015-01-13 LAB — CYTOLOGY - PAP

## 2015-01-14 ENCOUNTER — Encounter: Payer: Self-pay | Admitting: General Practice

## 2015-05-24 ENCOUNTER — Other Ambulatory Visit: Payer: Self-pay | Admitting: Family Medicine

## 2015-05-25 NOTE — Telephone Encounter (Signed)
Medication filled to pharmacy as requested.   

## 2015-09-30 ENCOUNTER — Other Ambulatory Visit: Payer: Self-pay | Admitting: Family Medicine

## 2015-10-01 ENCOUNTER — Encounter: Payer: Self-pay | Admitting: General Practice

## 2015-10-01 NOTE — Telephone Encounter (Signed)
Medication filled to pharmacy as requested.   

## 2015-10-01 NOTE — Telephone Encounter (Signed)
Letter mailed to pt.  

## 2015-10-01 NOTE — Telephone Encounter (Signed)
Pt was due for BP appt in June.  Please have her schedule f/u and her CPE if not already scheduled (due beginning of December)

## 2015-10-01 NOTE — Telephone Encounter (Signed)
Last OV 01/08/15 Lorazepam last filled 11/03/14 #30 with 0

## 2016-01-26 ENCOUNTER — Ambulatory Visit: Payer: 59 | Admitting: Family Medicine

## 2016-02-15 ENCOUNTER — Other Ambulatory Visit: Payer: Self-pay | Admitting: Family Medicine

## 2016-02-24 ENCOUNTER — Encounter: Payer: Self-pay | Admitting: Family Medicine

## 2016-02-24 ENCOUNTER — Other Ambulatory Visit (HOSPITAL_COMMUNITY)
Admission: RE | Admit: 2016-02-24 | Discharge: 2016-02-24 | Disposition: A | Discharge status: Home / Self Care | Payer: 59 | Source: Ambulatory Visit | Attending: Family Medicine | Admitting: Family Medicine

## 2016-02-24 ENCOUNTER — Ambulatory Visit (INDEPENDENT_AMBULATORY_CARE_PROVIDER_SITE_OTHER): Payer: 59 | Admitting: Family Medicine

## 2016-02-24 VITALS — BP 126/82 | HR 74 | Temp 98.2°F | Resp 17 | Ht 64.0 in | Wt 166.2 lb

## 2016-02-24 DIAGNOSIS — N898 Other specified noninflammatory disorders of vagina: Secondary | ICD-10-CM

## 2016-02-24 DIAGNOSIS — I10 Essential (primary) hypertension: Secondary | ICD-10-CM | POA: Diagnosis not present

## 2016-02-24 LAB — LIPID PANEL
Cholesterol: 194 mg/dL (ref <200)
HDL: 91 mg/dL (ref >50)
LDL Cholesterol: 92 mg/dL (ref <100)
TRIGLYCERIDES: 55 mg/dL (ref <150)
Total CHOL/HDL Ratio: 2.1 Ratio (ref <5.0)
VLDL: 11 mg/dL (ref <30)

## 2016-02-24 LAB — BASIC METABOLIC PANEL
BUN: 16 mg/dL (ref 7–25)
CALCIUM: 9.7 mg/dL (ref 8.6–10.2)
CO2: 22 mmol/L (ref 20–31)
CREATININE: 0.62 mg/dL (ref 0.50–1.10)
Chloride: 105 mmol/L (ref 98–110)
Glucose, Bld: 87 mg/dL (ref 65–99)
Potassium: 4.2 mmol/L (ref 3.5–5.3)
Sodium: 138 mmol/L (ref 135–146)

## 2016-02-24 LAB — TSH: TSH: 1.43 mIU/L

## 2016-02-24 LAB — CBC WITH DIFFERENTIAL/PLATELET
BASOS ABS: 83 {cells}/uL (ref 0–200)
Basophils Relative: 1 %
EOS ABS: 166 {cells}/uL (ref 15–500)
Eosinophils Relative: 2 %
HEMATOCRIT: 40 % (ref 35.0–45.0)
Hemoglobin: 12.8 g/dL (ref 11.7–15.5)
Lymphocytes Relative: 29 %
Lymphs Abs: 2407 cells/uL (ref 850–3900)
MCH: 26.7 pg — AB (ref 27.0–33.0)
MCHC: 32 g/dL (ref 32.0–36.0)
MCV: 83.5 fL (ref 80.0–100.0)
MONOS PCT: 8 %
MPV: 9.5 fL (ref 7.5–12.5)
Monocytes Absolute: 664 cells/uL (ref 200–950)
NEUTROS ABS: 4980 {cells}/uL (ref 1500–7800)
Neutrophils Relative %: 60 %
PLATELETS: 321 10*3/uL (ref 140–400)
RBC: 4.79 MIL/uL (ref 3.80–5.10)
RDW: 13.9 % (ref 11.0–15.0)
WBC: 8.3 10*3/uL (ref 3.8–10.8)

## 2016-02-24 LAB — HEPATIC FUNCTION PANEL
ALK PHOS: 43 U/L (ref 33–115)
ALT: 9 U/L (ref 6–29)
AST: 16 U/L (ref 10–35)
Albumin: 4.3 g/dL (ref 3.6–5.1)
BILIRUBIN TOTAL: 0.7 mg/dL (ref 0.2–1.2)
Bilirubin, Direct: 0.1 mg/dL (ref <=0.2)
Indirect Bilirubin: 0.6 mg/dL (ref 0.2–1.2)
TOTAL PROTEIN: 7.6 g/dL (ref 6.1–8.1)

## 2016-02-24 MED ORDER — HYDROCHLOROTHIAZIDE 12.5 MG PO TABS: 12.5000 mg | ORAL_TABLET | Freq: Every day | ORAL | 1 refills | Status: DC

## 2016-02-24 MED ORDER — METRONIDAZOLE 0.75 % VA GEL: 1.0000 | Freq: Every day | VAGINAL | 0 refills | Status: DC

## 2016-02-24 NOTE — Patient Instructions (Signed)
Schedule your complete physical in 6 months We'll notify you of your lab results and make any changes if needed Start the Metrogel nightly x5 nights (you will have medication left over) Restart the HCTZ daily at the 12.5mg  dose Keep up the good work on healthy diet and regular exercise Call with any questions or concerns Happy New Year!!!

## 2016-02-24 NOTE — Progress Notes (Signed)
   Subjective:    Patient ID: Angela Morris, female    DOB: [birth year], 46 y.o.   MRN: [id number]  HPI HTN- chronic problem, on HCTZ daily.  Pt has lost 13 lbs since last visit on [employer].  Denies CP, SOB, HAs, visual changes, edema.  Has been out of meds for ~1 week.  Vaginitis- pt douched ~2 weeks ago.  Some itching.  Pt is having thick yellow d/c.  + odor.   Review of Systems For ROS see HPI     Objective:   Physical Exam  Constitutional: She is oriented to person, place, and time. She appears well-developed and well-nourished. No distress.  HENT:  Head: Normocephalic and atraumatic.  Eyes: Conjunctivae and EOM are normal. Pupils are equal, round, and reactive to light.  Neck: Normal range of motion. Neck supple. No thyromegaly present.  Cardiovascular: Normal rate, regular rhythm, normal heart sounds and intact distal pulses.   No murmur heard. Pulmonary/Chest: Effort normal and breath sounds normal. No respiratory distress.  Abdominal: Soft. She exhibits no distension. There is no tenderness.  Musculoskeletal: She exhibits no edema.  Lymphadenopathy:    She has no cervical adenopathy.  Neurological: She is alert and oriented to person, place, and time.  Skin: Skin is warm and dry.  Psychiatric: She has a normal mood and affect. Her behavior is normal.  Vitals reviewed.         Assessment & Plan:

## 2016-02-24 NOTE — Progress Notes (Signed)
Pre visit review using our clinic review tool, if applicable. No additional management support is needed unless otherwise documented below in the visit note. 

## 2016-02-24 NOTE — Assessment & Plan Note (Signed)
Chronic problem.  Adequate control today.  Pt prefers to have BP in the 110s/70s so will restart HCTZ at 12.5mg  daily.  Check labs.  No anticipated med changes.  Will follow.

## 2016-02-24 NOTE — Assessment & Plan Note (Signed)
Pt has hx of BV and sxs are again consistent.  Check urine cytology and tx w/ Metrogel as pt prefers this over systemic oral tx.  Pt expressed understanding and is in agreement w/ plan.

## 2016-02-25 ENCOUNTER — Encounter: Payer: Self-pay | Admitting: General Practice

## 2016-03-01 LAB — URINE CYTOLOGY ANCILLARY ONLY: Candida vaginitis: NEGATIVE

## 2016-03-01 NOTE — Progress Notes (Signed)
Called pt and lmovm to return call.

## 2016-04-28 ENCOUNTER — Encounter: Payer: 59 | Admitting: Family Medicine

## 2016-08-25 ENCOUNTER — Encounter: Payer: 59 | Admitting: Family Medicine

## 2016-11-29 ENCOUNTER — Encounter: Payer: Self-pay | Admitting: Family Medicine

## 2016-11-29 ENCOUNTER — Ambulatory Visit (INDEPENDENT_AMBULATORY_CARE_PROVIDER_SITE_OTHER): Payer: 59 | Admitting: Family Medicine

## 2016-11-29 VITALS — BP 121/81 | HR 92 | Temp 98.1°F | Resp 16 | Ht 64.0 in | Wt 174.5 lb

## 2016-11-29 DIAGNOSIS — Z1231 Encounter for screening mammogram for malignant neoplasm of breast: Secondary | ICD-10-CM

## 2016-11-29 DIAGNOSIS — I10 Essential (primary) hypertension: Secondary | ICD-10-CM

## 2016-11-29 DIAGNOSIS — E559 Vitamin D deficiency, unspecified: Secondary | ICD-10-CM

## 2016-11-29 DIAGNOSIS — Z0000 Encounter for general adult medical examination without abnormal findings: Secondary | ICD-10-CM

## 2016-11-29 LAB — LIPID PANEL
CHOL/HDL RATIO: 2
Cholesterol: 202 mg/dL — ABNORMAL HIGH (ref 0–200)
HDL: 91.3 mg/dL (ref >39.00)
LDL Cholesterol: 99 mg/dL (ref 0–99)
NONHDL: 110.78
Triglycerides: 57 mg/dL (ref 0.0–149.0)
VLDL: 11.4 mg/dL (ref 0.0–40.0)

## 2016-11-29 LAB — CBC WITH DIFFERENTIAL/PLATELET
Basophils Absolute: 0.1 10*3/uL (ref 0.0–0.1)
Basophils Relative: 0.8 % (ref 0.0–3.0)
EOS PCT: 2 % (ref 0.0–5.0)
Eosinophils Absolute: 0.2 10*3/uL (ref 0.0–0.7)
HEMATOCRIT: 41.4 % (ref 36.0–46.0)
Hemoglobin: 13.3 g/dL (ref 12.0–15.0)
LYMPHS PCT: 24.6 % (ref 12.0–46.0)
Lymphs Abs: 1.8 10*3/uL (ref 0.7–4.0)
MCHC: 32.1 g/dL (ref 30.0–36.0)
MCV: 84.7 fl (ref 78.0–100.0)
Monocytes Absolute: 0.7 10*3/uL (ref 0.1–1.0)
Monocytes Relative: 9.8 % (ref 3.0–12.0)
NEUTROS ABS: 4.7 10*3/uL (ref 1.4–7.7)
Neutrophils Relative %: 62.8 % (ref 43.0–77.0)
Platelets: 339 10*3/uL (ref 150.0–400.0)
RBC: 4.88 Mil/uL (ref 3.87–5.11)
RDW: 13.7 % (ref 11.5–15.5)
WBC: 7.5 10*3/uL (ref 4.0–10.5)

## 2016-11-29 LAB — HEPATIC FUNCTION PANEL
ALT: 12 U/L (ref 0–35)
AST: 17 U/L (ref 0–37)
Albumin: 4.5 g/dL (ref 3.5–5.2)
Alkaline Phosphatase: 50 U/L (ref 39–117)
BILIRUBIN DIRECT: 0.1 mg/dL (ref 0.0–0.3)
BILIRUBIN TOTAL: 0.7 mg/dL (ref 0.2–1.2)
Total Protein: 7.6 g/dL (ref 6.0–8.3)

## 2016-11-29 LAB — BASIC METABOLIC PANEL
BUN: 15 mg/dL (ref 6–23)
CO2: 30 mEq/L (ref 19–32)
Calcium: 9.7 mg/dL (ref 8.4–10.5)
Chloride: 101 mEq/L (ref 96–112)
Creatinine, Ser: 0.6 mg/dL (ref 0.40–1.20)
GFR: 138.1 mL/min (ref >60.00)
Glucose, Bld: 80 mg/dL (ref 70–99)
POTASSIUM: 4.1 meq/L (ref 3.5–5.1)
SODIUM: 140 meq/L (ref 135–145)

## 2016-11-29 LAB — TSH: TSH: 1.78 u[IU]/mL (ref 0.35–4.50)

## 2016-11-29 LAB — VITAMIN D 25 HYDROXY (VIT D DEFICIENCY, FRACTURES): VITD: 12.81 ng/mL — ABNORMAL LOW (ref 30.00–100.00)

## 2016-11-29 NOTE — Assessment & Plan Note (Signed)
Pt's PE WNL.  UTD on pap, due for mammo- order entered.  UTD on Tdap.  Check labs.  Anticipatory guidance provided.

## 2016-11-29 NOTE — Progress Notes (Signed)
   Subjective:    Patient ID: Angela Morris, female    DOB: [birth year], 46 y.o.   MRN: [id number]  HPI CPE- UTD on pap.  Due for mammo- order entered.  UTD on Tdap.   Review of Systems Patient reports no vision/ hearing changes, adenopathy,fever, weight change,  persistant/recurrent hoarseness , swallowing issues, chest pain, palpitations, edema, persistant/recurrent cough, hemoptysis, dyspnea (rest/exertional/paroxysmal nocturnal), gastrointestinal bleeding (melena, rectal bleeding), abdominal pain, significant heartburn, bowel changes, GU symptoms (dysuria, hematuria, incontinence), Gyn symptoms (abnormal  bleeding, pain),  syncope, focal weakness, memory loss, numbness & tingling, skin/hair/nail changes, abnormal bruising or bleeding, anxiety, or depression.     Objective:   Physical Exam General Appearance:    Alert, cooperative, no distress, appears stated age  Head:    Normocephalic, without obvious abnormality, atraumatic  Eyes:    PERRL, conjunctiva/corneas clear, EOM's intact, fundi    benign, both eyes  Ears:    Normal TM's and external ear canals, both ears  Nose:   Nares normal, septum midline, mucosa normal, no drainage    or sinus tenderness  Throat:   Lips, mucosa, and tongue normal; teeth and gums normal  Neck:   Supple, symmetrical, trachea midline, no adenopathy;    Thyroid: no enlargement/tenderness/nodules  Back:     Symmetric, no curvature, ROM normal, no CVA tenderness  Lungs:     Clear to auscultation bilaterally, respirations unlabored  Chest Wall:    No tenderness or deformity   Heart:    Regular rate and rhythm, S1 and S2 normal, no murmur, rub   or gallop  Breast Exam:    Deferred to GYN  Abdomen:     Soft, non-tender, bowel sounds active all four quadrants,    no masses, no organomegaly  Genitalia:    Deferred to GYN  Rectal:    Extremities:   Extremities normal, atraumatic, no cyanosis or edema  Pulses:   2+ and symmetric all extremities  Skin:   Skin  color, texture, turgor normal, no rashes or lesions  Lymph nodes:   Cervical, supraclavicular, and axillary nodes normal  Neurologic:   CNII-XII intact, normal strength, sensation and reflexes    throughout          Assessment & Plan:

## 2016-11-29 NOTE — Patient Instructions (Signed)
Follow up in 6 months to recheck BP We'll notify you of your lab results and make any changes if needed Continue to work on healthy diet and regular exercise- you look great! Your mammogram order is in!  They should call you to schedule Call with any questions or concerns Happy Fall!!!

## 2016-11-29 NOTE — Assessment & Plan Note (Signed)
Check labs.  Replete prn. 

## 2016-11-29 NOTE — Assessment & Plan Note (Signed)
Well controlled today.  Asymptomatic at this time.  Check labs. No anticipated med changes.  Will follow.

## 2016-11-30 ENCOUNTER — Other Ambulatory Visit: Payer: Self-pay | Admitting: General Practice

## 2016-11-30 ENCOUNTER — Encounter: Payer: 59 | Admitting: Family Medicine

## 2016-11-30 MED ORDER — VITAMIN D (ERGOCALCIFEROL) 1.25 MG (50000 UNIT) PO CAPS: 50000.0000 [IU] | ORAL_CAPSULE | ORAL | 0 refills | Status: DC

## 2016-12-21 ENCOUNTER — Ambulatory Visit
Admission: RE | Admit: 2016-12-21 | Discharge: 2016-12-21 | Disposition: A | Discharge status: Home / Self Care | Payer: 59 | Source: Ambulatory Visit | Attending: Family Medicine | Admitting: Family Medicine

## 2016-12-21 DIAGNOSIS — Z1231 Encounter for screening mammogram for malignant neoplasm of breast: Secondary | ICD-10-CM | POA: Diagnosis not present

## 2016-12-23 ENCOUNTER — Other Ambulatory Visit: Payer: Self-pay | Admitting: Family Medicine

## 2017-02-23 ENCOUNTER — Telehealth: Payer: Self-pay | Admitting: Family Medicine

## 2017-02-23 MED ORDER — HYDROCHLOROTHIAZIDE 12.5 MG PO TABS: ORAL_TABLET | ORAL | 0 refills | Status: DC

## 2017-02-23 NOTE — Telephone Encounter (Signed)
Copied from [place] [id number]. Topic: Quick Communication - Rx Refill/Question >> Feb 23, 2017  8:44 AM Percell Belt A wrote: Medication: hydrochlorothiazide (HYDRODIURIL) 12.5 MG tablet [[id number]]    Has the patient contacted their pharmacy? No   (Agent: If no, request that the patient contact the pharmacy for the refill.)   Preferred Pharmacy (with phone number or street name): [facility] on [address]. Ins only covers 90 days supplies    Agent: Please be advised that RX refills may take up to 3 business days. We ask that you follow-up with your pharmacy.

## 2017-03-22 ENCOUNTER — Ambulatory Visit: Payer: 59 | Admitting: Family Medicine

## 2017-03-22 ENCOUNTER — Other Ambulatory Visit: Payer: Self-pay

## 2017-03-22 ENCOUNTER — Encounter: Payer: Self-pay | Admitting: Family Medicine

## 2017-03-22 VITALS — BP 123/83 | HR 76 | Temp 98.2°F | Resp 16 | Ht 64.0 in | Wt 182.1 lb

## 2017-03-22 DIAGNOSIS — R6889 Other general symptoms and signs: Secondary | ICD-10-CM

## 2017-03-22 LAB — POC INFLUENZA A&B (BINAX/QUICKVUE)
Influenza A, POC: NEGATIVE
Influenza B, POC: NEGATIVE

## 2017-03-22 MED ORDER — OSELTAMIVIR PHOSPHATE 75 MG PO CAPS: 75.0000 mg | ORAL_CAPSULE | Freq: Two times a day (BID) | ORAL | 0 refills | Status: DC

## 2017-03-22 NOTE — Progress Notes (Signed)
   Subjective:    Patient ID: Angela Morris, female    DOB: Jun 26, 1970, 47 y.o.   MRN: [id number]  HPI Sore throat- sxs started 2 days ago.  Pt reports low grade fevers.  + chills.  + body aches.  No cough.  No sinus pain/pressure.  Mild HA.  Some discomfort w/ swallowing.  + sick contacts.   Review of Systems For ROS see HPI     Objective:   Physical Exam  Constitutional: She is oriented to person, place, and time. She appears well-developed and well-nourished. No distress.  Obviously not feeling well  HENT:  Head: Normocephalic and atraumatic.  Right Ear: Tympanic membrane normal.  Left Ear: Tympanic membrane normal.  Nose: Mucosal edema and rhinorrhea present. Right sinus exhibits no maxillary sinus tenderness and no frontal sinus tenderness. Left sinus exhibits no maxillary sinus tenderness and no frontal sinus tenderness.  Mouth/Throat: Uvula is midline and mucous membranes are normal. Posterior oropharyngeal erythema present. No oropharyngeal exudate.  Eyes: Conjunctivae and EOM are normal. Pupils are equal, round, and reactive to light.  Neck: Normal range of motion. Neck supple.  Cardiovascular: Normal rate, regular rhythm and normal heart sounds.  Pulmonary/Chest: Effort normal and breath sounds normal. No respiratory distress. She has no wheezes.  No cough  Lymphadenopathy:    She has no cervical adenopathy.  Neurological: She is alert and oriented to person, place, and time.  Skin: Skin is warm.  Psychiatric: She has a normal mood and affect. Her behavior is normal.  Vitals reviewed.         Assessment & Plan:  Flu like sxs- new.  Pt's sxs, rapid onset and sick contacts are concerning for flu.  POCT test (-) but given exposure, will start Tamiflu.  Reviewed supportive care and red flags that should prompt return.  Pt expressed understanding and is in agreement w/ plan.

## 2017-03-22 NOTE — Patient Instructions (Signed)
Follow up as needed or as scheduled Thankfully the flu test is negative but we will treat this as an Influenza-like illness (ILI) w/ Tamiflu twice daily Alterate tylenol and ibuprofen every 4 hrs for pain/fever Drink plenty of fluids REST!! Call with any questions or concerns Hang in there!!!

## 2017-03-27 ENCOUNTER — Telehealth: Payer: Self-pay | Admitting: Family Medicine

## 2017-03-27 MED ORDER — HYDROCHLOROTHIAZIDE 12.5 MG PO TABS: ORAL_TABLET | ORAL | 0 refills | Status: DC

## 2017-03-27 NOTE — Telephone Encounter (Signed)
Please advise 

## 2017-03-27 NOTE — Telephone Encounter (Signed)
Medication filled to pharmacy as requested.  Pt will be informed med filled.

## 2017-03-27 NOTE — Telephone Encounter (Signed)
Pt went out of town and left med home. Wants to know if she can get a refill of hydrochlorothiazide sent to where she is right now.  The drug store is [facility], [place], [place]  LOV 03/22/17  Last refill was  02/23/17  PCP: Dr. Annye Asa

## 2017-03-27 NOTE — Telephone Encounter (Signed)
Copied from [place] [id number]. Topic: Quick Communication - Rx Refill/Question >> Mar 27, 2017 10:58 AM Oliver Pila B wrote: Medication: hydrochlorothiazide (HYDRODIURIL) 12.5 MG tablet [[id number]]    Has the patient contacted their pharmacy? No.   (Agent: If no, request that the patient contact the pharmacy for the refill.)   Preferred Pharmacy (with phone number or street name): [facility]; [phone] [phone]; [phone]   Agent: Please be advised that RX refills may take up to 3 business days. We ask that you follow-up with your pharmacy.

## 2017-03-27 NOTE — Telephone Encounter (Signed)
[place] for #30 of HCTZ to pharmacy where she currently is

## 2017-05-22 ENCOUNTER — Other Ambulatory Visit: Payer: Self-pay | Admitting: Family Medicine

## 2017-07-06 ENCOUNTER — Encounter (HOSPITAL_BASED_OUTPATIENT_CLINIC_OR_DEPARTMENT_OTHER): Payer: Self-pay | Admitting: Emergency Medicine

## 2017-07-06 ENCOUNTER — Other Ambulatory Visit: Payer: Self-pay

## 2017-07-06 ENCOUNTER — Emergency Department (HOSPITAL_BASED_OUTPATIENT_CLINIC_OR_DEPARTMENT_OTHER)
Admission: EM | Admit: 2017-07-06 | Discharge: 2017-07-06 | Disposition: A | Discharge status: Home / Self Care | Payer: 59 | Attending: Emergency Medicine | Admitting: Emergency Medicine

## 2017-07-06 DIAGNOSIS — R21 Rash and other nonspecific skin eruption: Secondary | ICD-10-CM

## 2017-07-06 DIAGNOSIS — I10 Essential (primary) hypertension: Secondary | ICD-10-CM | POA: Insufficient documentation

## 2017-07-06 DIAGNOSIS — M12811 Other specific arthropathies, not elsewhere classified, right shoulder: Secondary | ICD-10-CM | POA: Insufficient documentation

## 2017-07-06 DIAGNOSIS — Z79899 Other long term (current) drug therapy: Secondary | ICD-10-CM | POA: Insufficient documentation

## 2017-07-06 HISTORY — DX: Zoster without complications: B02.9

## 2017-07-06 MED ORDER — VALACYCLOVIR HCL 1 G PO TABS: 1000.0000 mg | ORAL_TABLET | Freq: Three times a day (TID) | ORAL | 0 refills | Status: DC

## 2017-07-06 MED ORDER — VALACYCLOVIR HCL 500 MG PO TABS
500.0000 mg | ORAL_TABLET | Freq: Once | ORAL | Status: AC
  Administered 2017-07-06: 500 mg via ORAL
  Filled 2017-07-06: qty 1

## 2017-07-06 NOTE — ED Notes (Signed)
Pt verbalizes understanding of d/c instructions and denies any further needs at this time. 

## 2017-07-06 NOTE — ED Triage Notes (Signed)
Pt has rash on right arm and c/o right side pain. Pt states she has previously had shingles and it feels the same.

## 2017-07-06 NOTE — ED Provider Notes (Signed)
[facility] Provider Note: Georgena Spurling, MD, FACEP  CSN: [id number] MRN: [id number] ARRIVAL: 07/06/17 at [place]: [place]  07/06/17 1:58 AM Angela Morris is a 47 y.o. female with a history of shingles.  She has had pain in her right shoulder for over a month.  She rates the pain as a 6 out of 10 and it is worse with certain movements, notably abduction and internal rotation.  She has also had several lesions on the skin of her right upper extremity for about a month.  She has been applying Neosporin with partial relief.  She is here now because she developed malaise and a new aching pain in her right arm along with a few new skin lesions.  The new lesions are small erythematous papules which are painful and pruritic.  She states her symptoms are similar to previous shingles which is why she is here.  She has had a subjective fever but is not febrile in the ED.   Past Medical History:  Diagnosis Date  . Anxiety   . Depression   . Hypertension   . Shingles     Past Surgical History:  Procedure Laterality Date  . TUBAL LIGATION  2002    Family History  Problem Relation Age of Onset  . Hypertension Mother   . Hypertension Father   . Stroke Father   . Coronary artery disease Unknown        female relative <50    Social History   Tobacco Use  . Smoking status: Never Smoker  . Smokeless tobacco: Never Used  Substance Use Topics  . Alcohol use: Yes    Comment: social  . Drug use: Not on file    Prior to Admission medications   Medication Sig Start Date End Date Taking? Authorizing Provider  hydrochlorothiazide (HYDRODIURIL) 12.5 MG tablet TAKE 1 TABLET(12.5 MG) BY MOUTH DAILY 03/27/17   Midge Minium, MD  hydrochlorothiazide (HYDRODIURIL) 12.5 MG tablet TAKE 1 TABLET(12.5 MG) BY MOUTH DAILY 05/22/17   Midge Minium, MD  LORazepam (ATIVAN) 0.5 MG tablet TAKE 1/2 TO 1 TABLET BY MOUTH AS  NEEDED FOR SEVERE ANXIETY 10/01/15   Midge Minium, MD  oseltamivir (TAMIFLU) 75 MG capsule Take 1 capsule (75 mg total) by mouth 2 (two) times daily. 03/22/17   Midge Minium, MD    Allergies Penicillins and Sulfonamide derivatives   REVIEW OF SYSTEMS  Negative except as noted here or in the History of Present Illness.   PHYSICAL EXAMINATION  Initial Vital Signs Blood pressure (!) 158/98, pulse 89, temperature 98.2 F (36.8 C), temperature source Oral, resp. rate 16, height 5\' 4"  (1.626 m), weight 79.4 kg (175 lb), SpO2 100 %.  Examination General: Well-developed, well-nourished female in no acute distress; appearance consistent with age of record HENT: normocephalic; atraumatic Eyes: pupils equal, round and reactive to light; extraocular muscles intact Neck: supple Heart: regular rate and rhythm Lungs: clear to auscultation bilaterally Abdomen: soft; nondistended; nontender; bowel sounds present Extremities: No deformity; full range of motion; pulses normal; pain in right shoulder on certain movements, notably abduction and internal rotation Neurologic: Awake, alert and oriented; motor function intact in all extremities and symmetric; no facial droop; no hyperesthesia of right upper extremity Skin: Warm and dry; resolving scaly plaques of right forearm with a few acute appearing erythematous papules:    Psychiatric: Normal mood and affect   RESULTS  Summary of this visit's results, reviewed by myself:   EKG Interpretation  Date/Time:    Ventricular Rate:    PR Interval:    QRS Duration:   QT Interval:    QTC Calculation:   R Axis:     Text Interpretation:        Laboratory Studies: No results found for this or any previous visit (from the past 24 hour(s)). Imaging Studies: No results found.  ED COURSE and MDM  Nursing notes and initial vitals signs, including pulse oximetry, reviewed.  Vitals:   07/06/17 0131 07/06/17 0133  BP:  (!) 158/98    Pulse:  89  Resp:  16  Temp:  98.2 F (36.8 C)  TempSrc:  Oral  SpO2:  100%  Weight: 79.4 kg (175 lb)   Height: 5\' 4"  (1.626 m)    The patient's right shoulder pain seems more consistent with rotator cuff syndrome as it is been present for an extended period of time and examination is consistent.  The scaly plaques on her right forearm are subacute and nonspecific.  She was advised to follow-up with her primary care physician as these may require biopsy.  The patient's new symptoms, right arm pain, malaise and new rash do not argue strongly for shingles.  Given that the patient believes that this is an early shingles outbreak, and the relatively benign nature of Valtrex, we will treat presumptively.  She declines analgesics.  PROCEDURES    ED DIAGNOSES     ICD-10-CM   1. Rash and nonspecific skin eruption R21   2. Rotator cuff arthropathy, right M12.811        Shanon Rosser, MD 07/06/17 [phone]

## 2017-07-07 ENCOUNTER — Ambulatory Visit: Payer: 59 | Admitting: Family Medicine

## 2017-07-07 ENCOUNTER — Encounter: Payer: Self-pay | Admitting: Family Medicine

## 2017-07-07 ENCOUNTER — Other Ambulatory Visit: Payer: Self-pay

## 2017-07-07 VITALS — BP 118/80 | HR 79 | Temp 98.6°F | Resp 16 | Ht 64.0 in | Wt 184.8 lb

## 2017-07-07 DIAGNOSIS — L989 Disorder of the skin and subcutaneous tissue, unspecified: Secondary | ICD-10-CM | POA: Diagnosis not present

## 2017-07-07 DIAGNOSIS — M25511 Pain in right shoulder: Secondary | ICD-10-CM | POA: Diagnosis not present

## 2017-07-07 MED ORDER — CLOTRIMAZOLE-BETAMETHASONE 1-0.05 % EX CREA: 1.0000 "application " | TOPICAL_CREAM | Freq: Two times a day (BID) | CUTANEOUS | 3 refills | Status: DC

## 2017-07-07 NOTE — Progress Notes (Signed)
   Subjective:    Patient ID: Angela Morris, female    DOB: Mar 13, 1970, 47 y.o.   MRN: [id number]  HPI R shoulder pain- pain worsens w/ certain movements.  + empty can test.  Pt is unable to cross R arm and place on L shoulder.  sxs started ~1 month ago.  ER felt sxs were consistent w/ rotator cuff.  Skin lesions- pt has had 2 lesions on R forearm since March.  She has been applying hydrocortisone and neosporin.  Areas are now hyperpigmented.  Wednesday developed 6 new areas on R arm and 1 area on L hand.  Pt was worried about shingles so she went to the ER yesterday- notes reviewed.     Review of Systems For ROS see HPI     Objective:   Physical Exam  Constitutional: She is oriented to person, place, and time. She appears well-developed and well-nourished. No distress.  HENT:  Head: Normocephalic and atraumatic.  Cardiovascular: Intact distal pulses.  Musculoskeletal: She exhibits no edema or deformity.  + empty can test on R + hawkins test (unable to get R arm onto L shoulder) Limited internal rotation  Neurological: She is alert and oriented to person, place, and time.  Skin: Skin is warm and dry.  Hyperpigmented, papular rash on arms bilaterally  Psychiatric: She has a normal mood and affect. Her behavior is normal. Thought content normal.  Vitals reviewed.         Assessment & Plan:  R shoulder pain- new.  Pt w/ + impingement signs.  Refer to ortho for complete evaluation and tx.  Pt expressed understanding and is in agreement w/ plan.   Skin lesions- not consistent w/ shingles.  Told her there is no need for Valtrex.  Some lesions are more consistent w/ Tinea Corporis.  Start Lotrisone BID.  Reviewed supportive care and red flags that should prompt return.  Pt expressed understanding and is in agreement w/ plan.

## 2017-07-07 NOTE — Patient Instructions (Signed)
Follow up as needed or as scheduled Start the Lotrisone cream twice daily on the spots/itchy areas We'll call you with your Ortho appt for the shoulder pain If no improvement in the skin after 7-10 days, let me know so we can refer to derm Call with any questions or concerns Have a great summer!!

## 2017-08-21 DIAGNOSIS — M25512 Pain in left shoulder: Secondary | ICD-10-CM | POA: Diagnosis not present

## 2017-08-21 DIAGNOSIS — M7501 Adhesive capsulitis of right shoulder: Secondary | ICD-10-CM | POA: Diagnosis not present

## 2017-08-21 DIAGNOSIS — M25511 Pain in right shoulder: Secondary | ICD-10-CM | POA: Diagnosis not present

## 2017-10-16 ENCOUNTER — Other Ambulatory Visit: Payer: Self-pay | Admitting: Family Medicine

## 2017-10-16 MED ORDER — LORAZEPAM 0.5 MG PO TABS: ORAL_TABLET | ORAL | 0 refills | Status: DC

## 2017-10-16 NOTE — Telephone Encounter (Signed)
Copied from [place] [id number]. Topic: Quick Communication - See Telephone Encounter >> Oct 16, 2017 11:22 AM Marja Kays F wrote: Pt is needing a refill on her lorazepam [facility] [place]  Best number is [phone]

## 2017-10-16 NOTE — Telephone Encounter (Signed)
Lorazepam refill Last Refill:10/01/15 # 30 with 0 refill Last OV: 07/07/17 PCP: Dr. Birdie Riddle Pharmacy:[facility]

## 2017-11-25 ENCOUNTER — Other Ambulatory Visit: Payer: Self-pay | Admitting: Family Medicine

## 2017-11-30 ENCOUNTER — Ambulatory Visit: Payer: 59 | Admitting: Family Medicine

## 2017-12-01 ENCOUNTER — Other Ambulatory Visit: Payer: Self-pay

## 2017-12-01 ENCOUNTER — Encounter: Payer: Self-pay | Admitting: Family Medicine

## 2017-12-01 ENCOUNTER — Ambulatory Visit: Payer: 59 | Admitting: Family Medicine

## 2017-12-01 ENCOUNTER — Other Ambulatory Visit (HOSPITAL_COMMUNITY)
Admission: RE | Admit: 2017-12-01 | Discharge: 2017-12-01 | Disposition: A | Discharge status: Home / Self Care | Payer: 59 | Source: Ambulatory Visit | Attending: Family Medicine | Admitting: Family Medicine

## 2017-12-01 VITALS — BP 122/81 | HR 82 | Temp 97.9°F | Resp 16 | Ht 64.0 in | Wt 185.4 lb

## 2017-12-01 DIAGNOSIS — R8290 Unspecified abnormal findings in urine: Secondary | ICD-10-CM

## 2017-12-01 DIAGNOSIS — R05 Cough: Secondary | ICD-10-CM | POA: Diagnosis not present

## 2017-12-01 DIAGNOSIS — L989 Disorder of the skin and subcutaneous tissue, unspecified: Secondary | ICD-10-CM

## 2017-12-01 DIAGNOSIS — L502 Urticaria due to cold and heat: Secondary | ICD-10-CM

## 2017-12-01 DIAGNOSIS — I10 Essential (primary) hypertension: Secondary | ICD-10-CM

## 2017-12-01 DIAGNOSIS — R058 Other specified cough: Secondary | ICD-10-CM

## 2017-12-01 LAB — POCT URINALYSIS DIPSTICK
Bilirubin, UA: NEGATIVE
Glucose, UA: NEGATIVE
Leukocytes, UA: NEGATIVE
Nitrite, UA: NEGATIVE
PH UA: 6.5 (ref 5.0–8.0)
PROTEIN UA: NEGATIVE
RBC UA: NEGATIVE
SPEC GRAV UA: 1.015 (ref 1.010–1.025)
Urobilinogen, UA: 0.2 E.U./dL

## 2017-12-01 MED ORDER — CETIRIZINE HCL 10 MG PO TABS: 10.0000 mg | ORAL_TABLET | Freq: Every day | ORAL | 11 refills | Status: DC

## 2017-12-01 NOTE — Progress Notes (Signed)
   Subjective:    Patient ID: Angela Morris, female    DOB: 07/13/1970, 47 y.o.   MRN: [id number]  HPI HTN- chronic problem, on HCTZ daily w/ adequate control.  Overdue for f/u.  Cough- 'i'm getting over this crud' and pt now reports irritating cough.  Improving w/ using humidifier at night.  sxs have been going on x3 weeks, 'but i'm feeling 90% better'.  No fevers.  Abnormal urine odor- pt has hx of BV.  Pt reports 'milky' d/c.  No burning w/ urination.  Denies increased frequency.  A few weeks ago started 'Thrive'- odor predated Thrive.  Hives- pt reports 'it gets bad at night'.  sxs in groin, back.  sxs started ~2-3 months ago.  No change in detergent or body wash.  + night sweats.  Review of Systems For ROS see HPI     Objective:   Physical Exam  Constitutional: She is oriented to person, place, and time. She appears well-developed and well-nourished. No distress.  HENT:  Head: Normocephalic and atraumatic.  Eyes: Pupils are equal, round, and reactive to light. Conjunctivae and EOM are normal.  Neck: Normal range of motion. Neck supple. No thyromegaly present.  Cardiovascular: Normal rate, regular rhythm, normal heart sounds and intact distal pulses.  No murmur heard. Pulmonary/Chest: Effort normal and breath sounds normal. No respiratory distress.  Abdominal: Soft. She exhibits no distension. There is no tenderness.  Musculoskeletal: She exhibits no edema.  Lymphadenopathy:    She has no cervical adenopathy.  Neurological: She is alert and oriented to person, place, and time.  Skin: Skin is warm and dry. No rash noted. No erythema.  Psychiatric: She has a normal mood and affect. Her behavior is normal.  Vitals reviewed.         Assessment & Plan:  Abnormal urine odor- send urine for cytology to assess for BV as urine dip was (-).  If cytology shows infxn, will send abx.  Heat hives- new.  Pt has pictures of hives that occur overnight, particularly in creases and  sweaty areas.  Encouraged breathable fabrics and cooler sleeping environment.  Start Zyrtec. Will follow  Post-viral cough- new.  Reviewed dx w/ pt and provided reassurance.  Start Zyrtec  Skin lesions- pt asking for derm referral for assessment

## 2017-12-01 NOTE — Patient Instructions (Signed)
Schedule your complete physical in 6 months We'll notify you of your lab results and make any changes if needed The cough is a post-viral cough and is due to airway inflammation.  This will improve w/ time, fluids, and rest START once daily Zyrtec.  You can buy over the counter or use the prescription I sent in (whichever is cheaper).  This will help w/ cough and hives Try and wear loose, breathable clothing to help prevent heat hives Call with any questions or concerns Hang in there!

## 2017-12-02 LAB — LIPID PANEL
CHOL/HDL RATIO: 2.5 (calc) (ref <5.0)
CHOLESTEROL: 214 mg/dL — AB (ref <200)
HDL: 87 mg/dL (ref >50)
LDL Cholesterol (Calc): 113 mg/dL (calc) — ABNORMAL HIGH
Non-HDL Cholesterol (Calc): 127 mg/dL (calc) (ref <130)
Triglycerides: 56 mg/dL (ref <150)

## 2017-12-02 LAB — HEPATIC FUNCTION PANEL
AG Ratio: 1.5 (calc) (ref 1.0–2.5)
ALBUMIN MSPROF: 4.6 g/dL (ref 3.6–5.1)
ALT: 20 U/L (ref 6–29)
AST: 24 U/L (ref 10–35)
Alkaline phosphatase (APISO): 46 U/L (ref 33–115)
BILIRUBIN DIRECT: 0.1 mg/dL (ref 0.0–0.2)
GLOBULIN: 3 g/dL (ref 1.9–3.7)
Indirect Bilirubin: 0.4 mg/dL (calc) (ref 0.2–1.2)
TOTAL PROTEIN: 7.6 g/dL (ref 6.1–8.1)
Total Bilirubin: 0.5 mg/dL (ref 0.2–1.2)

## 2017-12-02 LAB — CBC WITH DIFFERENTIAL/PLATELET
BASOS ABS: 81 {cells}/uL (ref 0–200)
Basophils Relative: 1 %
EOS PCT: 1.7 %
Eosinophils Absolute: 138 cells/uL (ref 15–500)
HCT: 40.5 % (ref 35.0–45.0)
Hemoglobin: 13 g/dL (ref 11.7–15.5)
Lymphs Abs: 2017 cells/uL (ref 850–3900)
MCH: 26.6 pg — ABNORMAL LOW (ref 27.0–33.0)
MCHC: 32.1 g/dL (ref 32.0–36.0)
MCV: 82.8 fL (ref 80.0–100.0)
MONOS PCT: 8.4 %
MPV: 10.7 fL (ref 7.5–12.5)
NEUTROS PCT: 64 %
Neutro Abs: 5184 cells/uL (ref 1500–7800)
PLATELETS: 347 10*3/uL (ref 140–400)
RBC: 4.89 10*6/uL (ref 3.80–5.10)
RDW: 13 % (ref 11.0–15.0)
TOTAL LYMPHOCYTE: 24.9 %
WBC mixed population: 680 cells/uL (ref 200–950)
WBC: 8.1 10*3/uL (ref 3.8–10.8)

## 2017-12-02 LAB — BASIC METABOLIC PANEL
BUN: 17 mg/dL (ref 7–25)
CO2: 23 mmol/L (ref 20–32)
CREATININE: 0.66 mg/dL (ref 0.50–1.10)
Calcium: 9.6 mg/dL (ref 8.6–10.2)
Chloride: 103 mmol/L (ref 98–110)
Glucose, Bld: 84 mg/dL (ref 65–99)
POTASSIUM: 4.3 mmol/L (ref 3.5–5.3)
Sodium: 137 mmol/L (ref 135–146)

## 2017-12-02 LAB — TSH: TSH: 1.69 m[IU]/L

## 2017-12-04 ENCOUNTER — Telehealth: Payer: Self-pay | Admitting: Family Medicine

## 2017-12-04 NOTE — Telephone Encounter (Signed)
Copied from [place] [id number]. Topic: Quick Communication - See Telephone Encounter >> Dec 04, 2017 12:24 PM Blase Mess A wrote: CRM for notification. See Telephone encounter for: 12/04/17.  Patient is calling to receive her 12/01/17 lab results [phone]

## 2017-12-04 NOTE — Telephone Encounter (Signed)
Called and advised pt that PCP was at [facility] and I would call her with results as soon as they were commented on.

## 2017-12-05 NOTE — Assessment & Plan Note (Signed)
Chronic problem.  Well control today.  Asymptomatic.  Check labs.  No anticipated med changes.  Will follow.

## 2017-12-06 LAB — URINE CYTOLOGY ANCILLARY ONLY
BACTERIAL VAGINITIS: NEGATIVE
Candida vaginitis: NEGATIVE

## 2017-12-11 ENCOUNTER — Other Ambulatory Visit: Payer: Self-pay | Admitting: Family Medicine

## 2018-01-02 ENCOUNTER — Other Ambulatory Visit: Payer: Self-pay | Admitting: General Practice

## 2018-01-02 MED ORDER — HYDROCHLOROTHIAZIDE 12.5 MG PO TABS: 12.5000 mg | ORAL_TABLET | Freq: Every day | ORAL | 1 refills | Status: DC

## 2018-01-13 ENCOUNTER — Emergency Department (HOSPITAL_BASED_OUTPATIENT_CLINIC_OR_DEPARTMENT_OTHER)
Admission: EM | Admit: 2018-01-13 | Discharge: 2018-01-13 | Disposition: A | Discharge status: Home / Self Care | Payer: 59 | Attending: Emergency Medicine | Admitting: Emergency Medicine

## 2018-01-13 ENCOUNTER — Encounter (HOSPITAL_BASED_OUTPATIENT_CLINIC_OR_DEPARTMENT_OTHER): Payer: Self-pay | Admitting: Emergency Medicine

## 2018-01-13 ENCOUNTER — Other Ambulatory Visit: Payer: Self-pay

## 2018-01-13 DIAGNOSIS — N3090 Cystitis, unspecified without hematuria: Secondary | ICD-10-CM | POA: Diagnosis not present

## 2018-01-13 DIAGNOSIS — I10 Essential (primary) hypertension: Secondary | ICD-10-CM | POA: Diagnosis not present

## 2018-01-13 DIAGNOSIS — R319 Hematuria, unspecified: Secondary | ICD-10-CM | POA: Diagnosis present

## 2018-01-13 DIAGNOSIS — Z79899 Other long term (current) drug therapy: Secondary | ICD-10-CM | POA: Insufficient documentation

## 2018-01-13 LAB — URINALYSIS, MICROSCOPIC (REFLEX)

## 2018-01-13 LAB — CBC WITH DIFFERENTIAL/PLATELET
ABS IMMATURE GRANULOCYTES: 0.04 10*3/uL (ref 0.00–0.07)
BASOS PCT: 1 %
Basophils Absolute: 0.1 10*3/uL (ref 0.0–0.1)
EOS ABS: 0.2 10*3/uL (ref 0.0–0.5)
EOS PCT: 2 %
HCT: 40.9 % (ref 36.0–46.0)
Hemoglobin: 12.6 g/dL (ref 12.0–15.0)
Immature Granulocytes: 0 %
Lymphocytes Relative: 18 %
Lymphs Abs: 2.1 10*3/uL (ref 0.7–4.0)
MCH: 26.3 pg (ref 26.0–34.0)
MCHC: 30.8 g/dL (ref 30.0–36.0)
MCV: 85.4 fL (ref 80.0–100.0)
MONO ABS: 0.7 10*3/uL (ref 0.1–1.0)
MONOS PCT: 6 %
Neutro Abs: 8.4 10*3/uL — ABNORMAL HIGH (ref 1.7–7.7)
Neutrophils Relative %: 73 %
PLATELETS: 310 10*3/uL (ref 150–400)
RBC: 4.79 MIL/uL (ref 3.87–5.11)
RDW: 14 % (ref 11.5–15.5)
WBC: 11.5 10*3/uL — ABNORMAL HIGH (ref 4.0–10.5)
nRBC: 0 % (ref 0.0–0.2)

## 2018-01-13 LAB — URINALYSIS, ROUTINE W REFLEX MICROSCOPIC

## 2018-01-13 LAB — BASIC METABOLIC PANEL
Anion gap: 8 (ref 5–15)
BUN: 16 mg/dL (ref 6–20)
CO2: 24 mmol/L (ref 22–32)
CREATININE: 0.57 mg/dL (ref 0.44–1.00)
Calcium: 9.1 mg/dL (ref 8.9–10.3)
Chloride: 105 mmol/L (ref 98–111)
GFR calc Af Amer: >60 mL/min (ref >60)
GLUCOSE: 90 mg/dL (ref 70–99)
POTASSIUM: 3.6 mmol/L (ref 3.5–5.1)
Sodium: 137 mmol/L (ref 135–145)

## 2018-01-13 LAB — PREGNANCY, URINE: PREG TEST UR: NEGATIVE

## 2018-01-13 MED ORDER — FOSFOMYCIN TROMETHAMINE 3 G PO PACK: 3.0000 g | PACK | Freq: Once | ORAL | 0 refills | Status: AC

## 2018-01-13 NOTE — Discharge Instructions (Addendum)
1. Medications: fosfomycin, usual home medications 2. Treatment: rest, drink plenty of fluids, take medications as prescribed 3. Follow Up: Please follow up with your primary doctor in 3 days for discussion of your diagnoses and further evaluation after today's visit; if you do not have a primary care doctor use the resource guide provided to find one; return to the ER for fevers, persistent vomiting, worsening abdominal pain or other concerning symptoms.

## 2018-01-13 NOTE — ED Provider Notes (Signed)
[facility] Provider Note   CSN: [id number] Arrival date & time: 01/13/18  1552     History   Chief Complaint Chief Complaint  Patient presents with  . Hematuria    HPI Angela Morris is a 47 y.o. female with a PMH of HTN presenting with hematuria and urinary frequency onset this morning. Patient reports pain is worse during urination and better with rest. Patient describes pain as a burning and heaviness. Patient reports associated suprapubic tenderness. Patient states she started taking apple cider vinegar pills for weight loss. Patient states she is sexually active, but denies any sexual activity in the last month. Patient denies fever, chills, night sweats, nausea, or vomiting. LMP was 12/11.   HPI  Past Medical History:  Diagnosis Date  . Anxiety   . Depression   . Hypertension   . Shingles     Patient Active Problem List   Diagnosis Date Noted  . Vitamin D deficiency 11/29/2016  . Vaginal discharge 11/03/2014  . Chest pain, atypical 07/01/2013  . Screening for malignant neoplasm of cervix 12/27/2011  . Routine general medical examination at a health care facility 12/27/2011  . Eczema 10/26/2011  . Leg pain 07/06/2010  . EXCESSIVE OR FREQUENT MENSTRUATION 09/07/2009  . Samar Venneman POSITIVE 08/24/2007  . Anxiety 06/07/2006  . Essential hypertension 06/07/2006    Past Surgical History:  Procedure Laterality Date  . TUBAL LIGATION  2002     OB History   No obstetric history on file.      Home Medications    Prior to Admission medications   Medication Sig Start Date End Date Taking? Authorizing Provider  cetirizine (ZYRTEC) 10 MG tablet Take 1 tablet (10 mg total) by mouth daily. 12/01/17   Midge Minium, MD  clotrimazole-betamethasone (LOTRISONE) cream Apply 1 application topically 2 (two) times daily. 07/07/17   Midge Minium, MD  fosfomycin (MONUROL) 3 g PACK Take 3 g by mouth once for 1 dose. 01/13/18 01/13/18   Darlin Drop P, PA-C  hydrochlorothiazide (HYDRODIURIL) 12.5 MG tablet Take 1 tablet (12.5 mg total) by mouth daily. 01/02/18   Midge Minium, MD  LORazepam (ATIVAN) 0.5 MG tablet TAKE 1/2 TO 1 TABLET BY MOUTH AS NEEDED FOR SEVERE ANXIETY 10/16/17   Midge Minium, MD  valACYclovir (VALTREX) 1000 MG tablet Take 1 tablet (1,000 mg total) by mouth 3 (three) times daily. Patient not taking: Reported on 07/07/2017 07/06/17   Molpus, Jenny Reichmann, MD    Family History Family History  Problem Relation Age of Onset  . Hypertension Mother   . Hypertension Father   . Stroke Father   . Coronary artery disease Other        female relative <50    Social History Social History   Tobacco Use  . Smoking status: Never Smoker  . Smokeless tobacco: Never Used  Substance Use Topics  . Alcohol use: Yes    Comment: social  . Drug use: Not on file     Allergies   Penicillins and Sulfonamide derivatives   Review of Systems Review of Systems  Constitutional: Negative for chills, diaphoresis and fever.  Respiratory: Negative for cough and shortness of breath.   Cardiovascular: Negative for chest pain.  Gastrointestinal: Positive for abdominal pain. Negative for nausea and vomiting.  Endocrine: Negative for cold intolerance and heat intolerance.  Genitourinary: Positive for difficulty urinating, dysuria, frequency and hematuria. Negative for flank pain and vaginal discharge.  Musculoskeletal: Negative for back pain.  Skin: Negative for rash.  Allergic/Immunologic: Negative for immunocompromised state.  Hematological: Negative for adenopathy.     Physical Exam Updated Vital Signs BP (!) 151/106 (BP Location: Right Arm)   Pulse 82   Temp 98.6 F (37 C) (Oral)   Resp 18   Ht 5\' 4"  (1.626 m)   Wt 77.1 kg   LMP 01/09/2018   SpO2 100%   BMI 29.18 kg/m   Physical Exam Vitals signs and nursing note reviewed.  Constitutional:      General: She is not in acute distress.    Appearance: She  is well-developed. She is not diaphoretic.  HENT:     Head: Normocephalic and atraumatic.  Cardiovascular:     Rate and Rhythm: Normal rate and regular rhythm.     Heart sounds: Normal heart sounds. No murmur. No friction rub. No gallop.   Pulmonary:     Effort: Pulmonary effort is normal. No respiratory distress.     Breath sounds: Normal breath sounds. No wheezing or rales.  Abdominal:     General: There is no distension.     Palpations: Abdomen is soft.     Tenderness: There is abdominal tenderness in the suprapubic area. There is no right CVA tenderness, left CVA tenderness, guarding or rebound.  Musculoskeletal: Normal range of motion.  Skin:    Findings: No erythema or rash.  Neurological:     Mental Status: She is alert and oriented to person, place, and time.      ED Treatments / Results  Labs (all labs ordered are listed, but only abnormal results are displayed) Labs Reviewed  URINALYSIS, ROUTINE W REFLEX MICROSCOPIC - Abnormal; Notable for the following components:      Result Value   Color, Urine RED (*)    APPearance TURBID (*)    Glucose, UA   (*)    Value: TEST NOT REPORTED DUE TO COLOR INTERFERENCE OF URINE PIGMENT   Hgb urine dipstick   (*)    Value: TEST NOT REPORTED DUE TO COLOR INTERFERENCE OF URINE PIGMENT   Bilirubin Urine   (*)    Value: TEST NOT REPORTED DUE TO COLOR INTERFERENCE OF URINE PIGMENT   Ketones, ur   (*)    Value: TEST NOT REPORTED DUE TO COLOR INTERFERENCE OF URINE PIGMENT   Protein, ur   (*)    Value: TEST NOT REPORTED DUE TO COLOR INTERFERENCE OF URINE PIGMENT   Nitrite   (*)    Value: TEST NOT REPORTED DUE TO COLOR INTERFERENCE OF URINE PIGMENT   Leukocytes, UA   (*)    Value: TEST NOT REPORTED DUE TO COLOR INTERFERENCE OF URINE PIGMENT   All other components within normal limits  URINALYSIS, MICROSCOPIC (REFLEX) - Abnormal; Notable for the following components:   Bacteria, UA MANY (*)    All other components within normal limits    CBC WITH DIFFERENTIAL/PLATELET - Abnormal; Notable for the following components:   WBC 11.5 (*)    Neutro Abs 8.4 (*)    All other components within normal limits  URINE CULTURE  PREGNANCY, URINE  BASIC METABOLIC PANEL    EKG None  Radiology No results found.  Procedures Procedures (including critical care time)  Medications Ordered in ED Medications - No data to display   Initial Impression / Assessment and Plan / ED Course  I have reviewed the triage vital signs and the nursing notes.  Pertinent labs & imaging results that were available during my care of  the patient were reviewed by me and considered in my medical decision making (see chart for details).  Clinical Course as of Jan 14 1904  Sat Jan 13, 2018  1847 BMP is unremarkable.   Basic metabolic panel [AH]  1828 Leukocytosis noted at 11.5. Suspect this is likely due to urinary tract infection.   WBC(!): 11.5 [AH]    Clinical Course User Index [AH] Darlin Drop P, PA-C   Pt has been diagnosed with a UTI. Pt is afebrile, no CVA tenderness, normotensive, and denies N/V. Pt to be dc home with antibiotics and instructions to follow up with PCP if symptoms persist. Instructed patient to return if she experiences new or worsening symptoms.   Final Clinical Impressions(s) / ED Diagnoses   Final diagnoses:  Cystitis    ED Discharge Orders         Ordered    fosfomycin (MONUROL) 3 g PACK   Once     01/13/18 1856           Arville Lime, PA-C 01/13/18 1905    Julianne Rice, MD 01/14/18 [phone]

## 2018-01-13 NOTE — ED Triage Notes (Signed)
Pt c/o hematuria and urinary urgency x 1 day.

## 2018-01-16 LAB — URINE CULTURE

## 2018-01-17 ENCOUNTER — Telehealth: Payer: Self-pay | Admitting: Emergency Medicine

## 2018-01-17 NOTE — Telephone Encounter (Signed)
Post ED Visit - Positive Culture Follow-up  Culture report reviewed by antimicrobial stewardship pharmacist:  []  Elenor Quinones, Pharm.D. []  Heide Guile, Pharm.D., BCPS AQ-ID [x]  Parks Neptune, Pharm.D., BCPS []  Alycia Rossetti, Pharm.D., BCPS []  McCleary, [place].D., BCPS, AAHIVP []  Legrand Como, Pharm.D., BCPS, AAHIVP []  Salome Arnt, PharmD, BCPS []  Johnnette Gourd, PharmD, BCPS []  Hughes Better, PharmD, BCPS []  Leeroy Cha, PharmD  Positive urine culture Treated with fosfomycin, organism sensitive to the same and no further patient follow-up is required at this time.  Hazle Nordmann 01/17/2018, 11:24 AM

## 2018-01-22 ENCOUNTER — Ambulatory Visit
Admission: RE | Admit: 2018-01-22 | Discharge: 2018-01-22 | Disposition: A | Discharge status: Home / Self Care | Payer: 59 | Source: Ambulatory Visit | Attending: Family Medicine | Admitting: Family Medicine

## 2018-01-22 ENCOUNTER — Encounter (INDEPENDENT_AMBULATORY_CARE_PROVIDER_SITE_OTHER): Payer: Self-pay

## 2018-01-22 ENCOUNTER — Other Ambulatory Visit: Payer: Self-pay | Admitting: Family Medicine

## 2018-01-22 DIAGNOSIS — Z1231 Encounter for screening mammogram for malignant neoplasm of breast: Secondary | ICD-10-CM | POA: Diagnosis not present

## 2018-01-23 ENCOUNTER — Encounter: Payer: Self-pay | Admitting: General Practice

## 2018-01-26 ENCOUNTER — Telehealth: Payer: Self-pay | Admitting: Emergency Medicine

## 2018-01-26 NOTE — Telephone Encounter (Signed)
Copied from [place] [id number]. Topic: General - Other >> Jan 26, 2018  3:26 PM Mcneil, Ja-Kwan wrote: Reason for CRM: Pt stated she was notified that the [facility] had forwarded her mammogram results and she would like a call back to discuss the results. Pt requests call back. Cb# [phone]

## 2018-01-26 NOTE — Telephone Encounter (Signed)
Spoke with pt to inform.  

## 2018-01-26 NOTE — Telephone Encounter (Signed)
Breast center is mailing letter directly to pt and pt's mammo was normal.

## 2018-04-24 DIAGNOSIS — L438 Other lichen planus: Secondary | ICD-10-CM | POA: Diagnosis not present

## 2018-04-24 DIAGNOSIS — K047 Periapical abscess without sinus: Secondary | ICD-10-CM | POA: Diagnosis not present

## 2018-04-24 DIAGNOSIS — K1120 Sialoadenitis, unspecified: Secondary | ICD-10-CM | POA: Diagnosis not present

## 2018-05-25 ENCOUNTER — Other Ambulatory Visit: Payer: Self-pay | Admitting: Family Medicine

## 2018-07-17 ENCOUNTER — Ambulatory Visit (INDEPENDENT_AMBULATORY_CARE_PROVIDER_SITE_OTHER): Payer: 59 | Admitting: Family Medicine

## 2018-07-17 ENCOUNTER — Encounter: Payer: 59 | Admitting: Family Medicine

## 2018-07-17 ENCOUNTER — Other Ambulatory Visit: Payer: Self-pay

## 2018-07-17 ENCOUNTER — Encounter: Payer: Self-pay | Admitting: Family Medicine

## 2018-07-17 VITALS — BP 130/90 | HR 90 | Temp 98.2°F | Resp 17 | Ht 64.0 in | Wt 188.2 lb

## 2018-07-17 DIAGNOSIS — I10 Essential (primary) hypertension: Secondary | ICD-10-CM | POA: Diagnosis not present

## 2018-07-17 DIAGNOSIS — Z0000 Encounter for general adult medical examination without abnormal findings: Secondary | ICD-10-CM | POA: Diagnosis not present

## 2018-07-17 DIAGNOSIS — E559 Vitamin D deficiency, unspecified: Secondary | ICD-10-CM

## 2018-07-17 LAB — VITAMIN D 25 HYDROXY (VIT D DEFICIENCY, FRACTURES): VITD: 19.86 ng/mL — ABNORMAL LOW (ref 30.00–100.00)

## 2018-07-17 LAB — CBC WITH DIFFERENTIAL/PLATELET
Basophils Absolute: 0.1 10*3/uL (ref 0.0–0.1)
Basophils Relative: 1.2 % (ref 0.0–3.0)
Eosinophils Absolute: 0.2 10*3/uL (ref 0.0–0.7)
Eosinophils Relative: 2.5 % (ref 0.0–5.0)
HCT: 41.7 % (ref 36.0–46.0)
Hemoglobin: 13.4 g/dL (ref 12.0–15.0)
Lymphocytes Relative: 20.3 % (ref 12.0–46.0)
Lymphs Abs: 1.4 10*3/uL (ref 0.7–4.0)
MCHC: 32.1 g/dL (ref 30.0–36.0)
MCV: 84.6 fl (ref 78.0–100.0)
Monocytes Absolute: 0.5 10*3/uL (ref 0.1–1.0)
Monocytes Relative: 7 % (ref 3.0–12.0)
Neutro Abs: 4.7 10*3/uL (ref 1.4–7.7)
Neutrophils Relative %: 69 % (ref 43.0–77.0)
Platelets: 312 10*3/uL (ref 150.0–400.0)
RBC: 4.92 Mil/uL (ref 3.87–5.11)
RDW: 14.9 % (ref 11.5–15.5)
WBC: 6.8 10*3/uL (ref 4.0–10.5)

## 2018-07-17 LAB — BASIC METABOLIC PANEL
BUN: 12 mg/dL (ref 6–23)
CO2: 27 mEq/L (ref 19–32)
Calcium: 9.5 mg/dL (ref 8.4–10.5)
Chloride: 104 mEq/L (ref 96–112)
Creatinine, Ser: 0.59 mg/dL (ref 0.40–1.20)
GFR: 131.56 mL/min (ref >60.00)
Glucose, Bld: 88 mg/dL (ref 70–99)
Potassium: 4.4 mEq/L (ref 3.5–5.1)
Sodium: 138 mEq/L (ref 135–145)

## 2018-07-17 LAB — LIPID PANEL
Cholesterol: 205 mg/dL — ABNORMAL HIGH (ref 0–200)
HDL: 83.9 mg/dL (ref >39.00)
LDL Cholesterol: 110 mg/dL — ABNORMAL HIGH (ref 0–99)
NonHDL: 121.32
Total CHOL/HDL Ratio: 2
Triglycerides: 58 mg/dL (ref 0.0–149.0)
VLDL: 11.6 mg/dL (ref 0.0–40.0)

## 2018-07-17 LAB — HEPATIC FUNCTION PANEL
ALT: 9 U/L (ref 0–35)
AST: 15 U/L (ref 0–37)
Albumin: 4.4 g/dL (ref 3.5–5.2)
Alkaline Phosphatase: 50 U/L (ref 39–117)
Bilirubin, Direct: 0.1 mg/dL (ref 0.0–0.3)
Total Bilirubin: 0.8 mg/dL (ref 0.2–1.2)
Total Protein: 7.3 g/dL (ref 6.0–8.3)

## 2018-07-17 LAB — TSH: TSH: 2.78 u[IU]/mL (ref 0.35–4.50)

## 2018-07-17 MED ORDER — CLOTRIMAZOLE-BETAMETHASONE 1-0.05 % EX CREA: 1.0000 "application " | TOPICAL_CREAM | Freq: Two times a day (BID) | CUTANEOUS | 3 refills | Status: DC

## 2018-07-17 MED ORDER — CETIRIZINE HCL 10 MG PO TABS: 10.0000 mg | ORAL_TABLET | Freq: Every day | ORAL | 11 refills | Status: DC

## 2018-07-17 MED ORDER — HYDROCHLOROTHIAZIDE 12.5 MG PO TABS: 12.5000 mg | ORAL_TABLET | Freq: Every day | ORAL | 1 refills | Status: DC

## 2018-07-17 NOTE — Assessment & Plan Note (Signed)
Chronic problem.  Adequate control.  Asymptomatic.  No changes at this time

## 2018-07-17 NOTE — Assessment & Plan Note (Signed)
Pt has hx of this.  Check labs and replete prn. 

## 2018-07-17 NOTE — Assessment & Plan Note (Signed)
Pt's PE WNL.  UTD on mammo.  Due for pap- pt to call GYN.  Check labs.  Anticipatory guidance provided.

## 2018-07-17 NOTE — Progress Notes (Signed)
   Subjective:    Patient ID: Angela Morris, female    DOB: 1971-01-20, 48 y.o.   MRN: [id number]  HPI CPE- UTD on mammo, due for pap.  UTD on Tdap.   Review of Systems Patient reports no vision/ hearing changes, adenopathy,fever, weight change,  persistant/recurrent hoarseness , swallowing issues, chest pain, palpitations, edema, persistant/recurrent cough, hemoptysis, dyspnea (rest/exertional/paroxysmal nocturnal), gastrointestinal bleeding (melena, rectal bleeding), abdominal pain, significant heartburn, bowel changes, GU symptoms (dysuria, hematuria, incontinence), Gyn symptoms (abnormal  bleeding, pain),  syncope, focal weakness, memory loss, numbness & tingling, skin/hair/nail changes, abnormal bruising or bleeding, anxiety, or depression.     Objective:   Physical Exam General Appearance:    Alert, cooperative, no distress, appears stated age  Head:    Normocephalic, without obvious abnormality, atraumatic  Eyes:    PERRL, conjunctiva/corneas clear, EOM's intact, fundi    benign, both eyes  Ears:    Normal TM's and external ear canals, both ears  Nose:   Nares normal, septum midline, mucosa normal, no drainage    or sinus tenderness  Throat:   Lips, mucosa, and tongue normal; teeth and gums normal  Neck:   Supple, symmetrical, trachea midline, no adenopathy;    Thyroid: no enlargement/tenderness/nodules  Back:     Symmetric, no curvature, ROM normal, no CVA tenderness  Lungs:     Clear to auscultation bilaterally, respirations unlabored  Chest Wall:    No tenderness or deformity   Heart:    Regular rate and rhythm, S1 and S2 normal, no murmur, rub   or gallop  Breast Exam:    Deferred to GYN  Abdomen:     Soft, non-tender, bowel sounds active all four quadrants,    no masses, no organomegaly  Genitalia:    Deferred to GYN  Rectal:    Extremities:   Extremities normal, atraumatic, no cyanosis or edema  Pulses:   2+ and symmetric all extremities  Skin:   Skin color,  texture, turgor normal, no rashes or lesions  Lymph nodes:   Cervical, supraclavicular, and axillary nodes normal  Neurologic:   CNII-XII intact, normal strength, sensation and reflexes    throughout          Assessment & Plan:

## 2018-07-17 NOTE — Patient Instructions (Addendum)
Follow up in 6 months to recheck BP We'll notify you of your lab results and make any changes if needed Continue to work on healthy diet and regular exercise- you can do it! Call and schedule your pap w/ GYN Call with any questions or concerns Stay Safe!!!

## 2018-07-18 ENCOUNTER — Other Ambulatory Visit: Payer: Self-pay | Admitting: General Practice

## 2018-07-18 MED ORDER — VITAMIN D (ERGOCALCIFEROL) 1.25 MG (50000 UNIT) PO CAPS: 50000.0000 [IU] | ORAL_CAPSULE | ORAL | 0 refills | Status: DC

## 2018-10-24 ENCOUNTER — Other Ambulatory Visit: Payer: Self-pay | Admitting: Family Medicine

## 2018-10-24 DIAGNOSIS — Z1231 Encounter for screening mammogram for malignant neoplasm of breast: Secondary | ICD-10-CM

## 2018-11-28 ENCOUNTER — Other Ambulatory Visit: Payer: Self-pay

## 2018-11-28 ENCOUNTER — Encounter: Payer: Self-pay | Admitting: Obstetrics & Gynecology

## 2018-11-28 ENCOUNTER — Ambulatory Visit (HOSPITAL_BASED_OUTPATIENT_CLINIC_OR_DEPARTMENT_OTHER)
Admission: RE | Admit: 2018-11-28 | Discharge: 2018-11-28 | Disposition: A | Discharge status: Home / Self Care | Payer: 59 | Source: Ambulatory Visit | Attending: Obstetrics & Gynecology | Admitting: Obstetrics & Gynecology

## 2018-11-28 ENCOUNTER — Ambulatory Visit (INDEPENDENT_AMBULATORY_CARE_PROVIDER_SITE_OTHER): Payer: 59 | Admitting: Obstetrics & Gynecology

## 2018-11-28 VITALS — BP 146/82 | HR 72 | Wt 183.0 lb

## 2018-11-28 DIAGNOSIS — Z1151 Encounter for screening for human papillomavirus (HPV): Secondary | ICD-10-CM

## 2018-11-28 DIAGNOSIS — Z1211 Encounter for screening for malignant neoplasm of colon: Secondary | ICD-10-CM

## 2018-11-28 DIAGNOSIS — Z01419 Encounter for gynecological examination (general) (routine) without abnormal findings: Secondary | ICD-10-CM

## 2018-11-28 DIAGNOSIS — D219 Benign neoplasm of connective and other soft tissue, unspecified: Secondary | ICD-10-CM

## 2018-11-28 DIAGNOSIS — Z124 Encounter for screening for malignant neoplasm of cervix: Secondary | ICD-10-CM

## 2018-11-28 NOTE — Progress Notes (Signed)
Subjective:     Angela Morris is a 48 y.o. female here for a routine exam. G2P2 Pt reports monthly cycles that are becoming more freq. Reports cycles every 24-28 days. 3 days are heavy and the rest light. She reports that she has become accustomed to the heavy bleeding. She reports a h/o fibroids that were the size of a 'lemon' 4 years prev. She reports pelvic pressure and feels that her abd is bloated and never feels flat. She feels that the pain is her worst sx. The finds the bleeding 'bearable.'    Gynecologic History Patient's last menstrual period was 11/05/2018. Contraception: tubal ligation Last Pap: 01/08/2015. Results were: normal Last mammogram: 01/22/2018. Results were: normal  Obstetric History OB History  Gravida Para Term Preterm AB Living  2 2 2         SAB TAB Ectopic Multiple Live Births               # Outcome Date GA Lbr Len/2nd Weight Sex Delivery Anes PTL Lv  2 Term 10/06/00          1 Term 03/13/96             The following portions of the patient's history were reviewed and updated as appropriate: allergies, current medications, past family history, past medical history, past social history, past surgical history and problem list.  Review of Systems Pertinent items are noted in HPI.    Objective:  BP (!) 146/82   Pulse 72   Wt 183 lb (83 kg)   LMP 11/05/2018   BMI 31.41 kg/m  General Appearance:    Alert, cooperative, no distress, appears stated age  Head:    Normocephalic, without obvious abnormality, atraumatic  Eyes:    conjunctiva/corneas clear, EOM's intact, both eyes  Ears:    Normal external ear canals, both ears  Nose:   Nares normal, septum midline, mucosa normal, no drainage    or sinus tenderness  Throat:   Lips, mucosa, and tongue normal; teeth and gums normal  Neck:   Supple, symmetrical, trachea midline, no adenopathy;    thyroid:  no enlargement/tenderness/nodules  Back:     Symmetric, no curvature, ROM normal, no CVA  tenderness  Lungs:     respirations unlabored  Chest Wall:    No tenderness or deformity   Heart:    Regular rate and rhythm  Breast Exam:    No tenderness, masses, or nipple abnormality  Abdomen:     Soft, non-tender, bowel sounds active all four quadrants,    no masses, no organomegaly  Genitalia:    Normal female without lesion, discharge or tenderness; the uterus is enlarged ~16 weeks sized and mobile. The uterus is irreg contour consistent with fibroids and larger on the right than the left side.     Extremities:   Extremities normal, atraumatic, no cyanosis or edema  Pulses:   2+ and symmetric all extremities  Skin:   Skin color, texture, turgor normal, no rashes or lesions     Assessment:    Healthy female exam.   Menorrhagia Uterine fibroids- enlarging per pts history.   Reviewed management options for fibroids especially when the main sx is pressure  and pain, including [place] and hysterectomy. Written info given to pt on each. Pt would be  a candidate for a RATH.      Colon cancer screening Breast cancer screen Plan:  Mammogram in Dec Referral for colonoscopy F/u PAP and hrHPV testing Pelvic US  F/u after [place] to discuss management options for fibroids. Virtual visit ok.   F/u sooner prn  Symantha Steeber L. Harraway-Smith, M.D., Cherlynn June

## 2018-11-28 NOTE — Patient Instructions (Addendum)
Uterine Fibroids  Uterine fibroids (leiomyomas) are noncancerous (benign) tumors that can develop in the uterus. Fibroids may also develop in the fallopian tubes, cervix, or tissues (ligaments) near the uterus. You may have one or many fibroids. Fibroids vary in size, weight, and where they grow in the uterus. Some can become quite large. Most fibroids do not require medical treatment. What are the causes? The cause of this condition is not known. What increases the risk? You are more likely to develop this condition if you:  Are in your 30s or 40s and have not gone through menopause.  Have a family history of this condition.  Are of African-American descent.  Had your first period at an early age (early menarche).  Have not had any children (nulliparity).  Are overweight or obese. What are the signs or symptoms? Many women do not have any symptoms. Symptoms of this condition may include:  Heavy menstrual bleeding.  Bleeding or spotting between periods.  Pain and pressure in the pelvic area, between the hips.  Bladder problems, such as needing to urinate urgently or more often than usual.  Inability to have children (infertility).  Failure to carry pregnancy to term (miscarriage). How is this diagnosed? This condition may be diagnosed based on:  Your symptoms and medical history.  A physical exam.  A pelvic exam that includes feeling for any tumors.  Imaging tests, such as ultrasound or MRI. How is this treated? Treatment for this condition may include:  Seeing your health care provider for follow-up visits to monitor your fibroids for any changes.  Taking NSAIDs such as ibuprofen, naproxen, or aspirin to reduce pain.  Hormone medicines. These may be taken as a pill, given in an injection, or delivered by a T-shaped device that is inserted into the uterus (intrauterine device, IUD).  Surgery to remove one of the following: ? The fibroids (myomectomy). Your health  care provider may recommend this if fibroids affect your fertility and you want to become pregnant. ? The uterus (hysterectomy). ? Blood supply to the fibroids (uterine artery embolization). Follow these instructions at home:  Take over-the-counter and prescription medicines only as told by your health care provider.  Ask your health care provider if you should take iron pills or eat more iron-rich foods, such as dark green, leafy vegetables. Heavy menstrual bleeding can cause low iron levels.  If directed, apply heat to your back or abdomen to reduce pain. Use the heat source that your health care provider recommends, such as a moist heat pack or a heating pad. ? Place a towel between your skin and the heat source. ? Leave the heat on for 20-30 minutes. ? Remove the heat if your skin turns bright red. This is especially important if you are unable to feel pain, heat, or cold. You may have a greater risk of getting burned.  Pay close attention to your menstrual cycle. Tell your health care provider about any changes, such as: ? Increased blood flow that requires you to use more pads or tampons than usual. ? A change in the number of days that your period lasts. ? A change in symptoms that are associated with your period, such as back pain or cramps in your abdomen.  Keep all follow-up visits as told by your health care provider. This is important, especially if your fibroids need to be monitored for any changes. Contact a health care provider if you:  Have pelvic pain, back pain, or cramps in your abdomen that  do not get better with medicine or heat.  Develop new bleeding between periods.  Have increased bleeding during or between periods.  Feel unusually tired or weak.  Feel light-headed. Get help right away if you:  Faint.  Have pelvic pain that suddenly gets worse.  Have severe vaginal bleeding that soaks a tampon or pad in 30 minutes or less. Summary  Uterine fibroids are  noncancerous (benign) tumors that can develop in the uterus.  The exact cause of this condition is not known.  Most fibroids do not require medical treatment unless they affect your ability to have children (fertility).  Contact a health care provider if you have pelvic pain, back pain, or cramps in your abdomen that do not get better with medicines.  Make sure you know what symptoms should cause you to get help right away. This information is not intended to replace advice given to you by your health care provider. Make sure you discuss any questions you have with your health care provider. Document Released: 01/15/2000 Document Revised: 12/30/2016 Document Reviewed: 12/13/2016 Elsevier Patient Education  2020 [place]. Total Laparoscopic Hysterectomy A total laparoscopic hysterectomy is a minimally invasive surgery to remove the uterus and cervix. The fallopian tubes and ovaries can also be removed (bilateral salpingo-oophorectomy) during this surgery, if necessary. This procedure may be done to treat problems such as:  Noncancerous growths in the uterus (uterine fibroids) that cause symptoms.  A condition that causes the lining of the uterus (endometrium) to grow in other areas (endometriosis).  Problems with pelvic support. This is caused by weakened muscles of the pelvis following vaginal childbirth or menopause.  Cancer of the cervix, ovaries, uterus, or endometrium.  Excessive (dysfunctional) uterine bleeding. This surgery is performed by inserting a thin, lighted tube (laparoscope) and surgical instruments into small incisions in the abdomen. The laparoscope sends images to a monitor. The images help the health care provider perform the procedure. After this procedure, you will no longer be able to have a baby, and you will no longer have a menstrual period. Tell a health care provider about:  Any allergies you have.  All medicines you are taking, including vitamins, herbs,  eye drops, creams, and over-the-counter medicines.  Any problems you or family members have had with anesthetic medicines.  Any blood disorders you have.  Any surgeries you have had.  Any medical conditions you have.  Whether you are pregnant or may be pregnant. What are the risks? Generally, this is a safe procedure. However, problems may occur, including:  Infection.  Bleeding.  Blood clots in the legs or lungs.  Allergic reactions to medicines.  Damage to other structures or organs.  The risk that the surgery may have to be switched to the regular one in which a large incision is made in the abdomen (abdominal hysterectomy). What happens before the procedure? Staying hydrated Follow instructions from your health care provider about hydration, which may include:  Up to 2 hours before the procedure - you may continue to drink clear liquids, such as water, clear fruit juice, black coffee, and plain tea Eating and drinking restrictions Follow instructions from your health care provider about eating and drinking, which may include:  8 hours before the procedure - stop eating heavy meals or foods such as meat, fried foods, or fatty foods.  6 hours before the procedure - stop eating light meals or foods, such as toast or cereal.  6 hours before the procedure - stop drinking milk or  drinks that contain milk.  2 hours before the procedure - stop drinking clear liquids. Medicines  Ask your health care provider about: ? Changing or stopping your regular medicines. This is especially important if you are taking diabetes medicines or blood thinners. ? Taking over-the-counter medicines, vitamins, herbs, and supplements. ? Taking medicines such as aspirin and ibuprofen. These medicines can thin your blood. Do not take these medicines unless your health care provider tells you to take them.  You may be given antibiotic medicine to help prevent infection.  You may be asked to take  laxatives.  You may be given medicines to help prevent nausea and vomiting after the procedure. General instructions  Ask your health care provider how your surgical site will be marked or identified.  You may be asked to shower with a germ-killing soap.  Do not use any products that contain nicotine or tobacco, such as cigarettes and e-cigarettes. If you need help quitting, ask your health care provider.  You may have an exam or testing, such as an ultrasound to determine the size and shape of your pelvic organs.  You may have a blood or urine sample taken.  This procedure can affect the way you feel about yourself. Talk with your health care provider about the physical and emotional changes hysterectomy may cause.  Plan to have someone take you home from the hospital or clinic.  Plan to have a responsible adult care for you for at least 24 hours after you leave the hospital or clinic. This is important. What happens during the procedure?  To lower your risk of infection: ? Your health care team will wash or sanitize their hands. ? Your skin will be washed with soap. ? Hair may be removed from the surgical area.  An IV will be inserted into one of your veins.  You will be given one or more of the following: ? A medicine to help you relax (sedative). ? A medicine to make you fall asleep (general anesthetic).  You will be given antibiotic medicine through your IV.  A tube may be inserted down your throat to help you breathe during the procedure.  A gas (carbon dioxide) will be used to inflate your abdomen to allow your surgeon to see inside of your abdomen.  Three or four small incisions will be made in your abdomen.  A laparoscope will be inserted into one of your incisions. Surgical instruments will be inserted through the other incisions in order to perform the procedure.  Your uterus and cervix may be removed through your vagina or cut into small pieces and removed  through the small incisions. Any other organs that need to be removed will also be removed this way.  Carbon dioxide will be released from inside of your abdomen.  Your incisions will be closed with stitches (sutures).  A bandage (dressing) may be placed over your incisions. The procedure may vary among health care providers and hospitals. What happens after the procedure?  Your blood pressure, heart rate, breathing rate, and blood oxygen level will be monitored until the medicines you were given have worn off.  You will be given medicine for pain and nausea as needed.  Do not drive for 24 hours if you received a sedative. Summary  Total Laparoscopic hysterectomy is a procedure to remove your uterus, cervix and sometimes the fallopian tubes and ovaries.  This procedure can affect the way you feel about yourself. Talk with your health care provider about the  physical and emotional changes hysterectomy may cause.  After this procedure, you will no longer be able to have a baby, and you will no longer have a menstrual period.  You will be given pain medicine to control discomfort after this procedure. This information is not intended to replace advice given to you by your health care provider. Make sure you discuss any questions you have with your health care provider. Document Released: 11/14/2006 Document Revised: 12/30/2016 Document Reviewed: 03/30/2016 Elsevier Patient Education  2020 [place]. Uterine Artery Embolization for Fibroids  Uterine artery embolization is a procedure to shrink uterine fibroids. Uterine fibroids are masses of tissue (tumors) that can develop in the womb (uterus). They are also called leiomyomas. This type of tumor is not cancerous (benign) and does not spread to other parts of the body outside of the pelvic area. The pelvic area is the part of the body between the hip bones. You can have one or many fibroids. Fibroids can vary in size, shape, weight, and  where they grow in the uterus. Some can become quite large. In this procedure, a thin plastic tube (catheter) is used to inject a chemical that blocks off the blood supply to the fibroid, which causes the fibroid to shrink. Tell a health care provider about:  Any allergies you have.  All medicines you are taking, including vitamins, herbs, eye drops, creams, and over-the-counter medicines.  Any problems you or family members have had with anesthetic medicines.  Any blood disorders you have.  Any surgeries you have had.  Any medical conditions you have.  Whether you are pregnant or may be pregnant. What are the risks? Generally, this is a safe procedure. However, problems may occur, including:  Bleeding.  Allergic reactions to medicines or dyes.  Damage to other structures or organs.  Infection, including blood infection (septicemia).  Injury to the uterus from decreased blood supply.  Lack of menstrual periods (amenorrhea).  Death of tissue cells (necrosis) around your bladder or vulva.  Development of a hole between organs or from an organ to the surface of your skin (fistula).  Blood clot in the legs (deep vein thrombosis) or lung (pulmonary embolus).  Nausea and vomiting. What happens before the procedure? Staying hydrated Follow instructions from your health care provider about hydration, which may include:  Up to 2 hours before the procedure - you may continue to drink clear liquids, such as water, clear fruit juice, black coffee, and plain tea. Eating and drinking restrictions Follow instructions from your health care provider about eating and drinking, which may include:  8 hours before the procedure - stop eating heavy meals or foods such as meat, fried foods, or fatty foods.  6 hours before the procedure - stop eating light meals or foods, such as toast or cereal.  6 hours before the procedure - stop drinking milk or drinks that contain milk.  2 hours  before the procedure - stop drinking clear liquids. Medicines  Ask your health care provider about: ? Changing or stopping your regular medicines. This is especially important if you are taking diabetes medicines or blood thinners. ? Taking over-the-counter medicines, vitamins, herbs, and supplements. ? Taking medicines such as aspirin and ibuprofen. These medicines can thin your blood. Do not take these medicines unless your health care provider tells you to take them.  You may be given antibiotic medicine to help prevent infection.  You may be given medicine to prevent nausea and vomiting (antiemetic). General instructions  Ask  your health care provider how your surgical site will be marked or identified.  You may be asked to shower with a germ-killing soap.  Plan to have someone take you home from the hospital or clinic.  If you will be going home right after the procedure, plan to have someone with you for 24 hours.  You will be asked to empty your bladder. What happens during the procedure?  To lower your risk of infection: ? Your health care team will wash or sanitize their hands. ? Hair may be removed from the surgical area. ? Your skin will be washed with soap.  An IV will be inserted into one of your veins.  You will be given one or more of the following: ? A medicine to help you relax (sedative). ? A medicine to numb the area (local anesthetic).  A small cut (incision) will be made in your groin.  A catheter will be inserted into the main artery of your leg. The catheter will be guided through the artery to your uterus.  A series of images will be taken while dye is injected through the catheter in your groin. X-rays are taken at the same time. This is done to provide a road map of the blood supply to your uterus and fibroids.  Tiny plastic spheres, about the size of sand grains, will be injected through the catheter. Metal coils may be used to help block the  artery. The particles will lodge in tiny branches of the uterine artery that supplies blood to the fibroids.  The procedure will be repeated on the artery that supplies the other side of the uterus.  The catheter will be removed and pressure will be applied to stop the bleeding.  A dressing will be placed over the incision. The procedure may vary among health care providers and hospitals. What happens after the procedure?  Your blood pressure, heart rate, breathing rate, and blood oxygen level will be monitored until the medicines you were given have worn off.  You will be given pain medicine as needed.  You may be given medicine for nausea and vomiting as needed.  Do not drive for 24 hours if you were given a sedative. Summary  Uterine artery embolization is a procedure to shrink uterine fibroids by blocking the blood supply to the fibroid.  You may be given a sedative and local anesthetic for the procedure.  A catheter will be inserted into the main artery of your leg. The catheter will be guided through the artery to your uterus.  After the procedure you will be given pain medicine and medicine for nausea as needed.  Do not drive for 24 hours if you were given a sedative. This information is not intended to replace advice given to you by your health care provider. Make sure you discuss any questions you have with your health care provider. Document Released: 04/04/2005 Document Revised: 12/30/2016 Document Reviewed: 04/21/2016 Elsevier Patient Education  2020 [employer].

## 2018-11-28 NOTE — Progress Notes (Signed)
[place] Pap today  Last Pap 01/2015- negative  Mammogram schedule for 01/28/2019 Flu injection? No  Sti? No

## 2018-12-03 ENCOUNTER — Encounter: Payer: Self-pay | Admitting: Obstetrics & Gynecology

## 2018-12-03 ENCOUNTER — Telehealth (INDEPENDENT_AMBULATORY_CARE_PROVIDER_SITE_OTHER): Payer: 59 | Admitting: Obstetrics & Gynecology

## 2018-12-03 ENCOUNTER — Other Ambulatory Visit: Payer: Self-pay

## 2018-12-03 DIAGNOSIS — D252 Subserosal leiomyoma of uterus: Secondary | ICD-10-CM

## 2018-12-03 DIAGNOSIS — D251 Intramural leiomyoma of uterus: Secondary | ICD-10-CM | POA: Diagnosis not present

## 2018-12-03 DIAGNOSIS — D259 Leiomyoma of uterus, unspecified: Secondary | ICD-10-CM | POA: Insufficient documentation

## 2018-12-03 NOTE — Progress Notes (Signed)
TELEHEALTH VIRTUAL GYNECOLOGY VISIT ENCOUNTER NOTE  I connected with Angela Morris on 12/03/18 at 10:45 AM EST by telephone at home and verified that I am speaking with the correct person using two identifiers.   I discussed the limitations, risks, security and privacy concerns of performing an evaluation and management service by telephone and the availability of in person appointments. I also discussed with the patient that there may be a patient responsible charge related to this service. The patient expressed understanding and agreed to proceed.   History:  Angela Morris is a 48 y.o. [phone] female being evaluated today for eval of symptomatic fibroids including a pedunculated fibroid. Pt is s/p SVD x2. Pt denies h/o c-sections. She denies any abnormal vaginal discharge, bleeding, pelvic pain or other concerns.       Past Medical History:  Diagnosis Date  . Anxiety   . Depression   . Hypertension   . Shingles    Past Surgical History:  Procedure Laterality Date  . TUBAL LIGATION  2002   The following portions of the patient's history were reviewed and updated as appropriate: allergies, current medications, past family history, past medical history, past social history, past surgical history and problem list.    Review of Systems:  Pertinent items noted in HPI and remainder of comprehensive ROS otherwise negative.  Physical Exam:   General:  Alert, oriented and cooperative.   Mental Status: Normal mood and affect perceived. Normal judgment and thought content.  Physical exam deferred due to nature of the encounter  Labs and Imaging No results found for this or any previous visit (from the past 336 hour(s)). US Pelvic Complete With Transvaginal  Result Date: 11/28/2018 CLINICAL DATA:  Follow-up examination for uterine fibroids. EXAM: TRANSABDOMINAL AND TRANSVAGINAL ULTRASOUND OF PELVIS TECHNIQUE: Both transabdominal and transvaginal ultrasound  examinations of the pelvis were performed. Transabdominal technique was performed for global imaging of the pelvis including uterus, ovaries, adnexal regions, and pelvic cul-de-sac. It was necessary to proceed with endovaginal exam following the transabdominal exam to visualize the uterus, endometrium, and ovaries. COMPARISON:  Prior ultrasound from 08/31/2009. FINDINGS: Uterus Measurements: 9.2 x 5.0 x 5.3 cm = volume: 127 mL. Large pedunculated fibroid extending from the uterine fundus measures 8.3 x 9.7 x 9.9 cm, increased in size from previous. Multiple additional fibroids are now seen as well, including a 3.3 x 3.4 x 3.8 cm intramural fibroid at the left posterior uterine body. 2.1 x 2.1 x 2.6 intramural fibroid at the left anterior uterine body. 2.9 x 2.4 x 2.5 cm subserosal fibroid at the left posterior uterine body. Endometrium Thickness: 10 mm.  No focal abnormality visualized. Right ovary Measurements: 3.3 x 2.0 x 2.7 cm = volume: 9.6 mL. Normal appearance/no adnexal mass. Left ovary Measurements: 3.4 x 1.9 x 2.2 cm = volume: 7.4 mL. Normal appearance/no adnexal mass. Other findings No abnormal free fluid. IMPRESSION: 1. Enlarged fibroid uterus as above, with dominant 10 cm pedunculated fibroid extending from the uterine fundus. Overall, size and number of these fibroids has increased as compared to most recent ultrasound from 2011. 2. Otherwise unremarkable and normal pelvic ultrasound. Electronically Signed   By: Jeannine Boga M.D.   On: 11/28/2018 20:15      Assessment and Plan:     Uterine fibroids  review sx and treatment options. Given the pedunculated fibroids I do not suspect that the [place] will benefit pt as much.  Pt is considering surgery and wants to discuss this with  her insurance company prior to scheduling.    I discussed the assessment and treatment plan with the patient. The patient was provided an opportunity to ask questions and all were answered. The patient agreed with the  plan and demonstrated an understanding of the instructions.   The patient was advised to call back or seek an in-person evaluation/go to the ED if the symptoms worsen or if the condition fails to improve as anticipated.  I provided 14 minutes of non-face-to-face time during this encounter.   Lavonia Drafts, MD [facility], [place]

## 2018-12-03 NOTE — Progress Notes (Signed)
Patient had trouble with my chart app. Patient connected with Dr. Ihor Dow via telephone. Kathrene Alu RN

## 2018-12-05 LAB — CYTOLOGY - PAP
Comment: NEGATIVE
Diagnosis: NEGATIVE
High risk HPV: NEGATIVE

## 2018-12-16 ENCOUNTER — Other Ambulatory Visit: Payer: Self-pay

## 2018-12-16 ENCOUNTER — Encounter (HOSPITAL_BASED_OUTPATIENT_CLINIC_OR_DEPARTMENT_OTHER): Payer: Self-pay | Admitting: Emergency Medicine

## 2018-12-16 ENCOUNTER — Emergency Department (HOSPITAL_BASED_OUTPATIENT_CLINIC_OR_DEPARTMENT_OTHER)
Admission: EM | Admit: 2018-12-16 | Discharge: 2018-12-16 | Disposition: A | Discharge status: Home / Self Care | Payer: 59 | Attending: Emergency Medicine | Admitting: Emergency Medicine

## 2018-12-16 DIAGNOSIS — R202 Paresthesia of skin: Secondary | ICD-10-CM | POA: Diagnosis not present

## 2018-12-16 DIAGNOSIS — I10 Essential (primary) hypertension: Secondary | ICD-10-CM | POA: Insufficient documentation

## 2018-12-16 DIAGNOSIS — Z79899 Other long term (current) drug therapy: Secondary | ICD-10-CM | POA: Diagnosis not present

## 2018-12-16 LAB — CBC WITH DIFFERENTIAL/PLATELET
Abs Immature Granulocytes: 0.02 10*3/uL (ref 0.00–0.07)
Basophils Absolute: 0.1 10*3/uL (ref 0.0–0.1)
Basophils Relative: 1 %
Eosinophils Absolute: 0.2 10*3/uL (ref 0.0–0.5)
Eosinophils Relative: 4 %
HCT: 40.4 % (ref 36.0–46.0)
Hemoglobin: 12.6 g/dL (ref 12.0–15.0)
Immature Granulocytes: 0 %
Lymphocytes Relative: 31 %
Lymphs Abs: 2.2 10*3/uL (ref 0.7–4.0)
MCH: 26.8 pg (ref 26.0–34.0)
MCHC: 31.2 g/dL (ref 30.0–36.0)
MCV: 86 fL (ref 80.0–100.0)
Monocytes Absolute: 0.7 10*3/uL (ref 0.1–1.0)
Monocytes Relative: 10 %
Neutro Abs: 3.8 10*3/uL (ref 1.7–7.7)
Neutrophils Relative %: 54 %
Platelets: 282 10*3/uL (ref 150–400)
RBC: 4.7 MIL/uL (ref 3.87–5.11)
RDW: 13.8 % (ref 11.5–15.5)
WBC: 6.9 10*3/uL (ref 4.0–10.5)
nRBC: 0 % (ref 0.0–0.2)

## 2018-12-16 LAB — BASIC METABOLIC PANEL
Anion gap: 7 (ref 5–15)
BUN: 14 mg/dL (ref 6–20)
CO2: 24 mmol/L (ref 22–32)
Calcium: 9.4 mg/dL (ref 8.9–10.3)
Chloride: 104 mmol/L (ref 98–111)
Creatinine, Ser: 0.75 mg/dL (ref 0.44–1.00)
GFR calc Af Amer: >60 mL/min (ref >60)
GFR calc non Af Amer: >60 mL/min (ref >60)
Glucose, Bld: 105 mg/dL — ABNORMAL HIGH (ref 70–99)
Potassium: 3.8 mmol/L (ref 3.5–5.1)
Sodium: 135 mmol/L (ref 135–145)

## 2018-12-16 LAB — CBG MONITORING, ED: Glucose-Capillary: 90 mg/dL (ref 70–99)

## 2018-12-16 NOTE — ED Triage Notes (Signed)
Reports lying in bed when she woke up with right side of body numb from arm down to leg.  Reports it got better but then when trying to go to sleep noticed her left side went numb.  Reports right side still feels numb/cooling sensation.  Reports taking extra bp med tonight just in case.  Also reports feeling anxious and a little light headed.

## 2018-12-16 NOTE — ED Provider Notes (Signed)
[place] EMERGENCY DEPARTMENT Provider Note  CSN: [id number] Arrival date & time: 12/16/18 [id number]  Chief Complaint(s) Numbness  HPI [facility] Pate is a 48 y.o. female with a history of hypertension on HCTZ presents to the emergency department with bilateral upper and lower extremity paresthesias.  Patient reports that she awoke from sleeping around 11 PM with left-sided upper and lower extremity numbness and tingling which gradually resolved after getting up and moving around.  She reports that she was lying on her left side at that time.  Because of this she went to bed and lying on the right side.  She awoke later that night with same symptoms on the right side. Symptoms have been improving.  She denied any associated weakness.  No headache.  Concern for possible stroke and wanted to be evaluated.  No recent fevers or infections.  No chest pain or shortness of breath.  HPI  Past Medical History Past Medical History:  Diagnosis Date  . Anxiety   . Depression   . Hypertension   . Shingles    Patient Active Problem List   Diagnosis Date Noted  . Fibroid uterus 12/03/2018  . Vitamin D deficiency 11/29/2016  . Vaginal discharge 11/03/2014  . Screening for malignant neoplasm of cervix 12/27/2011  . Routine general medical examination at a health care facility 12/27/2011  . Eczema 10/26/2011  . EXCESSIVE OR FREQUENT MENSTRUATION 09/07/2009  . ANA POSITIVE 08/24/2007  . Anxiety 06/07/2006  . Essential hypertension 06/07/2006   Home Medication(s) Prior to Admission medications   Medication Sig Start Date End Date Taking? Authorizing Provider  cetirizine (ZYRTEC) 10 MG tablet Take 1 tablet (10 mg total) by mouth daily. 07/17/18  Yes Midge Minium, MD  hydrochlorothiazide (HYDRODIURIL) 12.5 MG tablet Take 1 tablet (12.5 mg total) by mouth daily. 07/17/18  Yes Midge Minium, MD  Vitamin D, Ergocalciferol, (DRISDOL) 1.25 MG (50000 UT) CAPS capsule Take 1  capsule (50,000 Units total) by mouth every 7 (seven) days. 07/18/18  Yes Midge Minium, MD  clotrimazole-betamethasone (LOTRISONE) cream Apply 1 application topically 2 (two) times daily. Patient not taking: Reported on 11/28/2018 07/17/18   Midge Minium, MD  LORazepam (ATIVAN) 0.5 MG tablet TAKE 1/2 TO 1 TABLET BY MOUTH AS NEEDED FOR SEVERE ANXIETY 10/16/17   Midge Minium, MD  valACYclovir (VALTREX) 1000 MG tablet Take 1 tablet (1,000 mg total) by mouth 3 (three) times daily. Patient not taking: Reported on 07/07/2017 07/06/17   Molpus, Jenny Reichmann, MD                                                                                                                                    Past Surgical History Past Surgical History:  Procedure Laterality Date  . TUBAL LIGATION  2002   Family History Family History  Problem Relation Age of Onset  . Hypertension Mother   . Hypertension Father   .  Stroke Father   . Coronary artery disease Other        female relative <50  . Breast cancer Neg Hx     Social History Social History   Tobacco Use  . Smoking status: Never Smoker  . Smokeless tobacco: Never Used  Substance Use Topics  . Alcohol use: Yes    Comment: social  . Drug use: Not Currently   Allergies Penicillins and Sulfonamide derivatives  Review of Systems Review of Systems All other systems are reviewed and are negative for acute change except as noted in the HPI  Physical Exam Vital Signs  I have reviewed the triage vital signs BP (!) 164/100 (BP Location: Right Arm)   Pulse 95   Temp 98.5 F (36.9 C) (Oral)   Resp 18   Ht 5\' 4"  (1.626 m)   Wt 79.4 kg   SpO2 100%   BMI 30.04 kg/m   Physical Exam Vitals signs reviewed.  Constitutional:      General: She is not in acute distress.    Appearance: She is well-developed. She is not diaphoretic.  HENT:     Head: Normocephalic and atraumatic.     Right Ear: External ear normal.     Left Ear: External ear  normal.     Nose: Nose normal.  Eyes:     General: No scleral icterus.    Conjunctiva/sclera: Conjunctivae normal.  Neck:     Musculoskeletal: Normal range of motion.     Trachea: Phonation normal.  Cardiovascular:     Rate and Rhythm: Normal rate and regular rhythm.  Pulmonary:     Effort: Pulmonary effort is normal. No respiratory distress.     Breath sounds: No stridor.  Abdominal:     General: There is no distension.  Musculoskeletal: Normal range of motion.  Neurological:     Mental Status: She is alert and oriented to person, place, and time.     Comments: Mental Status:  Alert and oriented to person, place, and time.  Attention and concentration normal.  Speech clear.  Recent memory is intact  Cranial Nerves:  II Visual Fields: Intact to confrontation. Visual fields intact. III, IV, VI: Pupils equal and reactive to light and near. Full eye movement without nystagmus  V Facial Sensation: Normal. No weakness of masticatory muscles  VII: No facial weakness or asymmetry  VIII Auditory Acuity: Grossly normal  IX/X: The uvula is midline; the palate elevates symmetrically  XI: Normal sternocleidomastoid and trapezius strength  XII: The tongue is midline. No atrophy or fasciculations.   Motor System: Muscle Strength: 5/5 and symmetric in the upper and lower extremities. No pronation or drift.  Muscle Tone: Tone and muscle bulk are normal in the upper and lower extremities.   Reflexes: DTRs: 1+ and symmetrical in all four extremities. No Clonus Coordination: Intact finger-to-nose. No tremor.  Sensation: Intact to light touch..  Gait: Routine  gait normal.   Psychiatric:        Behavior: Behavior normal.     ED Results and Treatments Labs (all labs ordered are listed, but only abnormal results are displayed) Labs Reviewed  BASIC METABOLIC PANEL - Abnormal; Notable for the following components:      Result Value   Glucose, Bld 105 (*)    All other components within  normal limits  CBC WITH DIFFERENTIAL/PLATELET  CBG MONITORING, ED  EKG  EKG Interpretation  Date/Time:    Ventricular Rate:    PR Interval:    QRS Duration:   QT Interval:    QTC Calculation:   R Axis:     Text Interpretation:        Radiology No results found.  Pertinent labs & imaging results that were available during my care of the patient were reviewed by me and considered in my medical decision making (see chart for details).  Medications Ordered in ED Medications - No data to display                                                                                                                                  Procedures Procedures  (including critical care time)  Medical Decision Making / ED Course I have reviewed the nursing notes for this encounter and the patient's prior records (if available in EHR or on provided paperwork).   Angela Morris was evaluated in Emergency Department on 12/16/2018 for the symptoms described in the history of present illness. She was evaluated in the context of the global COVID-19 pandemic, which necessitated consideration that the patient might be at risk for infection with the SARS-CoV-2 virus that causes COVID-19. Institutional protocols and algorithms that pertain to the evaluation of patients at risk for COVID-19 are in a state of rapid change based on information released by regulatory bodies including the CDC and federal and state organizations. These policies and algorithms were followed during the patient's care in the ED.  Bilateral paresthesia related to sleeping position.  No focal deficits on exam.  Not suspicious for CVA.  Given her hydrochlorothiazide use, will check potassium  No electrolyte derangements.  Potassium normal.  Rest of the labs reassuring.  Symptoms continued to improve.  The  patient appears reasonably screened and/or stabilized for discharge and I doubt any other medical condition or other [facility] requiring further screening, evaluation, or treatment in the ED at this time prior to discharge.  The patient is safe for discharge with strict return precautions.        Final Clinical Impression(s) / ED Diagnoses Final diagnoses:  Paresthesia    The patient appears reasonably screened and/or stabilized for discharge and I doubt any other medical condition or other [facility] requiring further screening, evaluation, or treatment in the ED at this time prior to discharge.  Disposition: Discharge  Condition: Good  I have discussed the results, Dx and Tx plan with the patient who expressed understanding and agree(s) with the plan. Discharge instructions discussed at great length. The patient was given strict return precautions who verbalized understanding of the instructions. No further questions at time of discharge.    ED Discharge Orders    None        Follow Up: Midge Minium, MD [address] [place] [address] [phone]  Schedule an appointment as soon as possible for a  visit  As needed      This chart was dictated using voice recognition software.  Despite best efforts to proofread,  errors can occur which can change the documentation meaning.   Fatima Blank, MD 12/16/18 [phone]

## 2018-12-26 ENCOUNTER — Telehealth: Payer: 59 | Admitting: Obstetrics & Gynecology

## 2019-01-07 ENCOUNTER — Telehealth: Payer: Self-pay | Admitting: Obstetrics & Gynecology

## 2019-01-07 NOTE — Telephone Encounter (Signed)
Returned call to pt. Pt would like a [place] in mid Jan.  She was at work and could not speak further. Will post and schedule virtual pre op.  Thx,  clh-S

## 2019-01-11 ENCOUNTER — Encounter: Payer: Self-pay | Admitting: *Deleted

## 2019-01-11 DIAGNOSIS — D259 Leiomyoma of uterus, unspecified: Secondary | ICD-10-CM

## 2019-01-11 DIAGNOSIS — N852 Hypertrophy of uterus: Secondary | ICD-10-CM

## 2019-01-28 ENCOUNTER — Other Ambulatory Visit: Payer: Self-pay

## 2019-01-28 ENCOUNTER — Ambulatory Visit
Admission: RE | Admit: 2019-01-28 | Discharge: 2019-01-28 | Disposition: A | Discharge status: Home / Self Care | Payer: 59 | Source: Ambulatory Visit | Attending: Family Medicine | Admitting: Family Medicine

## 2019-01-28 DIAGNOSIS — Z1231 Encounter for screening mammogram for malignant neoplasm of breast: Secondary | ICD-10-CM

## 2019-02-20 ENCOUNTER — Encounter: Payer: Self-pay | Admitting: Obstetrics & Gynecology

## 2019-02-20 ENCOUNTER — Ambulatory Visit (INDEPENDENT_AMBULATORY_CARE_PROVIDER_SITE_OTHER): Payer: 59 | Admitting: Obstetrics & Gynecology

## 2019-02-20 ENCOUNTER — Other Ambulatory Visit: Payer: Self-pay

## 2019-02-20 VITALS — BP 170/94 | HR 68 | Ht 64.0 in | Wt 184.0 lb

## 2019-02-20 DIAGNOSIS — N920 Excessive and frequent menstruation with regular cycle: Secondary | ICD-10-CM

## 2019-02-20 DIAGNOSIS — I10 Essential (primary) hypertension: Secondary | ICD-10-CM

## 2019-02-20 MED ORDER — HYDROCHLOROTHIAZIDE 25 MG PO TABS: 25.0000 mg | ORAL_TABLET | Freq: Every day | ORAL | 3 refills | Status: DC

## 2019-02-20 NOTE — Progress Notes (Signed)
Patient presents for preop appt. Kathrene Alu RN

## 2019-02-20 NOTE — Progress Notes (Signed)
History:  49 y.o. [id number] here today for preop. Pt reports that she cont to have menorrhagia. She was last seen in Novv 2020 where the options for surgery were reviewed. She has decided to have a RATH with bilateral salpingectomy and she is here to discuss the procedure. She denies new sx.   Pt is on meds for chronic HTN. She reports that she prev lost weight and the dosage of her meds was decreased but, now her BP is elevated.      The following portions of the patient's history were reviewed and updated as appropriate: allergies, current medications, past family history, past medical history, past social history, past surgical history and problem list.  Review of Systems:  Pertinent items are noted in HPI.    Objective:  Physical Exam Blood pressure (!) 170/94, pulse 68, height 5\' 4"  (1.626 m), weight 184 lb (83.5 kg).  CONSTITUTIONAL: Well-developed, well-nourished female in no acute distress.  HENT:  Normocephalic, atraumatic EYES: Conjunctivae and EOM are normal. No scleral icterus.  NECK: Normal range of motion SKIN: Skin is warm and dry. No rash noted. Not diaphoretic.No pallor. [place]: Alert and oriented to person, place, and time. Normal coordination.  Pelvic: deferred  Labs and Imaging MM DIGITAL SCREENING BILATERAL  Result Date: 01/28/2019 CLINICAL DATA:  Screening. EXAM: DIGITAL SCREENING BILATERAL MAMMOGRAM WITH CAD COMPARISON:  Previous exam(s). ACR Breast Density Category b: There are scattered areas of fibroglandular density. FINDINGS: There are no findings suspicious for malignancy. Images were processed with CAD. IMPRESSION: No mammographic evidence of malignancy. A result letter of this screening mammogram will be mailed directly to the patient. RECOMMENDATION: Screening mammogram in one year. (Code:SM-B-01Y) BI-RADS CATEGORY  1: Negative. Electronically Signed   By: Fidela Salisbury M.D.   On: 01/28/2019 11:51    Assessment & Plan:  AUB- Patient desires  surgical management with [place] with bilateral salpiongecty.  The risks of surgery were discussed in detail with the patient including but not limited to: bleeding which may require transfusion or reoperation; infection which may require prolonged hospitalization or re-hospitalization and antibiotic therapy; injury to bowel, bladder, ureters and major vessels or other surrounding organs; need for additional procedures including laparotomy; thromboembolic phenomenon, incisional problems and other postoperative or anesthesia complications.  Patient was told that the likelihood that her condition and symptoms will be treated effectively with this surgical management was very high; the postoperative expectations were also discussed in detail. The patient also understands the alternative treatment options which were discussed in full. All questions were answered.  She was told that she will be contacted by our surgical scheduler regarding the time and date of her surgery; routine preoperative instructions of having nothing to eat or drink after midnight on the day prior to surgery and also coming to the hospital 1 1/2 hours prior to her time of surgery were also emphasized.  She was told she may be called for a preoperative appointment about a week prior to surgery and will be given further preoperative instructions at that visit. Printed patient education handouts about the procedure were given to the patient to review at home.  Elevated BP-  Will increase HCTZ to 25 mg daily.   Total face-to-face time with patient was 20 min.  Greater than 50% was spent in counseling and coordination of care with the patient.   Lovelace Cerveny L. Harraway-Smith, M.D., Cherlynn June

## 2019-02-20 NOTE — Patient Instructions (Signed)
Total Laparoscopic Hysterectomy °A total laparoscopic hysterectomy is a minimally invasive surgery to remove the uterus and cervix. The fallopian tubes and ovaries can also be removed (bilateral salpingo-oophorectomy) during this surgery, if necessary. This procedure may be done to treat problems such as: °· Noncancerous growths in the uterus (uterine fibroids) that cause symptoms. °· A condition that causes the lining of the uterus (endometrium) to grow in other areas (endometriosis). °· Problems with pelvic support. This is caused by weakened muscles of the pelvis following vaginal childbirth or menopause. °· Cancer of the cervix, ovaries, uterus, or endometrium. °· Excessive (dysfunctional) uterine bleeding. °This surgery is performed by inserting a thin, lighted tube (laparoscope) and surgical instruments into small incisions in the abdomen. The laparoscope sends images to a monitor. The images help the health care provider perform the procedure. After this procedure, you will no longer be able to have a baby, and you will no longer have a menstrual period. °Tell a health care provider about: °· Any allergies you have. °· All medicines you are taking, including vitamins, herbs, eye drops, creams, and over-the-counter medicines. °· Any problems you or family members have had with anesthetic medicines. °· Any blood disorders you have. °· Any surgeries you have had. °· Any medical conditions you have. °· Whether you are pregnant or may be pregnant. °What are the risks? °Generally, this is a safe procedure. However, problems may occur, including: °· Infection. °· Bleeding. °· Blood clots in the legs or lungs. °· Allergic reactions to medicines. °· Damage to other structures or organs. °· The risk that the surgery may have to be switched to the regular one in which a large incision is made in the abdomen (abdominal hysterectomy). °What happens before the procedure? °Staying hydrated °Follow instructions from your  health care provider about hydration, which may include: °· Up to 2 hours before the procedure - you may continue to drink clear liquids, such as water, clear fruit juice, black coffee, and plain tea °Eating and drinking restrictions °Follow instructions from your health care provider about eating and drinking, which may include: °· 8 hours before the procedure - stop eating heavy meals or foods such as meat, fried foods, or fatty foods. °· 6 hours before the procedure - stop eating light meals or foods, such as toast or cereal. °· 6 hours before the procedure - stop drinking milk or drinks that contain milk. °· 2 hours before the procedure - stop drinking clear liquids. °Medicines °· Ask your health care provider about: °? Changing or stopping your regular medicines. This is especially important if you are taking diabetes medicines or blood thinners. °? Taking over-the-counter medicines, vitamins, herbs, and supplements. °? Taking medicines such as aspirin and ibuprofen. These medicines can thin your blood. Do not take these medicines unless your health care provider tells you to take them. °· You may be given antibiotic medicine to help prevent infection. °· You may be asked to take laxatives. °· You may be given medicines to help prevent nausea and vomiting after the procedure. °General instructions °· Ask your health care provider how your surgical site will be marked or identified. °· You may be asked to shower with a germ-killing soap. °· Do not use any products that contain nicotine or tobacco, such as cigarettes and e-cigarettes. If you need help quitting, ask your health care provider. °· You may have an exam or testing, such as an ultrasound to determine the size and shape of your pelvic organs. °·   You may have a blood or urine sample taken. °· This procedure can affect the way you feel about yourself. Talk with your health care provider about the physical and emotional changes hysterectomy may  cause. °· Plan to have someone take you home from the hospital or clinic. °· Plan to have a responsible adult care for you for at least 24 hours after you leave the hospital or clinic. This is important. °What happens during the procedure? °· To lower your risk of infection: °? Your health care team will wash or sanitize their hands. °? Your skin will be washed with soap. °? Hair may be removed from the surgical area. °· An IV will be inserted into one of your veins. °· You will be given one or more of the following: °? A medicine to help you relax (sedative). °? A medicine to make you fall asleep (general anesthetic). °· You will be given antibiotic medicine through your IV. °· A tube may be inserted down your throat to help you breathe during the procedure. °· A gas (carbon dioxide) will be used to inflate your abdomen to allow your surgeon to see inside of your abdomen. °· Three or four small incisions will be made in your abdomen. °· A laparoscope will be inserted into one of your incisions. Surgical instruments will be inserted through the other incisions in order to perform the procedure. °· Your uterus and cervix may be removed through your vagina or cut into small pieces and removed through the small incisions. Any other organs that need to be removed will also be removed this way. °· Carbon dioxide will be released from inside of your abdomen. °· Your incisions will be closed with stitches (sutures). °· A bandage (dressing) may be placed over your incisions. °The procedure may vary among health care providers and hospitals. °What happens after the procedure? °· Your blood pressure, heart rate, breathing rate, and blood oxygen level will be monitored until the medicines you were given have worn off. °· You will be given medicine for pain and nausea as needed. °· Do not drive for 24 hours if you received a sedative. °Summary °· Total Laparoscopic hysterectomy is a procedure to remove your uterus, cervix and  sometimes the fallopian tubes and ovaries. °· This procedure can affect the way you feel about yourself. Talk with your health care provider about the physical and emotional changes hysterectomy may cause. °· After this procedure, you will no longer be able to have a baby, and you will no longer have a menstrual period. °· You will be given pain medicine to control discomfort after this procedure. °This information is not intended to replace advice given to you by your health care provider. Make sure you discuss any questions you have with your health care provider. °Document Revised: 12/30/2016 Document Reviewed: 03/30/2016 °Elsevier Patient Education © 2020 [employer] °Total Laparoscopic Hysterectomy, Care After °This sheet gives you information about how to care for yourself after your procedure. Your health care provider may also give you more specific instructions. If you have problems or questions, contact your health care provider. °What can I expect after the procedure? °After the procedure, it is common to have: °· Pain and bruising around your incisions. °· A sore throat, if a breathing tube was used during surgery. °· Fatigue. °· Poor appetite. °· Less interest in sex. °If your ovaries were also removed, it is also common to have symptoms of menopause such as hot flashes, night sweats,   and lack of sleep (insomnia). °Follow these instructions at home: °Bathing °· Do not take baths, swim, or use a hot tub until your health care provider approves. You may need to only take showers for 2-3 weeks. °· Keep your bandage (dressing) dry until your health care provider says it can be removed. °Incision care ° °· Follow instructions from your health care provider about how to take care of your incisions. Make sure you: °? Wash your hands with soap and water before you change your dressing. If soap and water are not available, use hand sanitizer. °? Change your dressing as told by your health care  provider. °? Leave stitches (sutures), skin glue, or adhesive strips in place. These skin closures may need to stay in place for 2 weeks or longer. If adhesive strip edges start to loosen and curl up, you may trim the loose edges. Do not remove adhesive strips completely unless your health care provider tells you to do that. °· Check your incision area every day for signs of infection. Check for: °? Redness, swelling, or pain. °? Fluid or blood. °? Warmth. °? Pus or a bad smell. °Activity °· Get plenty of rest and sleep. °· Do not lift anything that is heavier than 10 lbs (4.5 kg) for one month after surgery, or as long as told by your health care provider. °· Do not drive or use heavy machinery while taking prescription pain medicine. °· Do not drive for 24 hours if you were given a medicine to help you relax (sedative). °· Return to your normal activities as told by your health care provider. Ask your health care provider what activities are safe for you. °Lifestyle ° °· Do not use any products that contain nicotine or tobacco, such as cigarettes and e-cigarettes. These can delay healing. If you need help quitting, ask your health care provider. °· Do not drink alcohol until your health care provider approves. °General instructions °· Do not douche, use tampons, or have sex for at least 6 weeks, or as told by your health care provider. °· Take over-the-counter and prescription medicines only as told by your health care provider. °· To monitor yourself for a fever, take your temperature at least once a day during recovery. °· If you struggle with physical or emotional changes after your procedure, speak with your health care provider or a therapist. °· To prevent or treat constipation while you are taking prescription pain medicine, your health care provider may recommend that you: °? Drink enough fluid to keep your urine clear or pale yellow. °? Take over-the-counter or prescription medicines. °? Eat foods that  are high in fiber, such as fresh fruits and vegetables, whole grains, and beans. °? Limit foods that are high in fat and processed sugars, such as fried and sweet foods. °· Keep all follow-up visits as told by your health care provider. This is important. °Contact a health care provider if: °· You have chills or a fever. °· You have redness, swelling, or pain around an incision. °· You have fluid or blood coming from an incision. °· Your incision feels warm to the touch. °· You have pus or a bad smell coming from an incision. °· An incision breaks open. °· You feel dizzy or light-headed. °· You have pain or bleeding when you urinate. °· You have diarrhea, nausea, or vomiting that does not go away. °· You have abnormal vaginal discharge. °· You have a rash. °· You have pain that does   not get better with medicine. °Get help right away if: °· You have a fever and your symptoms suddenly get worse. °· You have severe abdominal pain. °· You have chest pain. °· You have shortness of breath. °· You faint. °· You have pain, swelling, or redness on your leg. °· You have heavy vaginal bleeding with blood clots. °Summary °· After the procedure it is common to have abdominal pain. Your provider will give you medication for this. °· Do not take baths, swim, or use a hot tub until your health care provider approves. °· Do not lift anything that is heavier than 10 lbs (4.5 kg) for one month after surgery, or as long as told by your health care provider. °· Notify your provider if you have any signs or symptoms of infection after the procedure. °This information is not intended to replace advice given to you by your health care provider. Make sure you discuss any questions you have with your health care provider. °Document Revised: 12/30/2016 Document Reviewed: 03/30/2016 °Elsevier Patient Education © 2020 [employer] ° °

## 2019-02-26 NOTE — Patient Instructions (Addendum)
DUE TO COVID-19 ONLY ONE VISITOR IS ALLOWED IN WAITING ROOM (VISITOR WILL HAVE A TEMPERATURE CHECK ON ARRIVAL AND MUST WEAR A FACE MASK THE ENTIRE TIME.)  ONCE YOU ARE ADMITTED TO YOUR PRIVATE ROOM, THE SAME ONE VISITOR IS ALLOWED TO VISIT DURING VISITING HOURS ONLY.  Your COVID swab testing is scheduled for 03/01/19  At 3:00 pm , You must self quarantine after your testing per handout given to you at the testing site.  ([facility] [place] [facility] testing enter pre-surgical testing line)  [address], [place], [place] - the short stay covered drive at [facility] (Use the [employer] entrance to [facility] next to [facility].)   Your procedure is scheduled on: 03/05/19  Report to [facility] AT: 9:00  A. M.   Call this number if you have problems the morning of surgery:[phone].   OUR ADDRESS IS [address].  WE ARE LOCATED IN THE [place]                                   [facility].                                     REMEMBER:   DO NOT EAT FOOD OR DRINK LIQUIDS AFTER MIDNIGHT .    BRUSH YOUR TEETH THE MORNING OF SURGERY.  TAKE THESE MEDICATIONS MORNING OF SURGERY WITH A SIP OF WATER:  Cetirizine,lorazepam,omeprazole  DO NOT WEAR JEWERLY, MAKE UP, OR NAIL POLISH.  DO NOT WEAR LOTIONS, POWDERS, PERFUMES/COLOGNE OR DEODORANT.  DO NOT SHAVE FOR 24 HOURS PRIOR TO DAY OF SURGERY.    CONTACTS, GLASSES, OR DENTURES MAY NOT BE WORN TO SURGERY.                                    [facility] IS NOT RESPONSIBLE  FOR ANY BELONGINGS.          BRING ALL PRESCRIPTION MEDICATIONS WITH YOU THE DAY OF SURGERY IN ORIGINAL CONTAINERS                                                                .[place] - Preparing for Surgery Before surgery, you can play an important role.  Because skin is not sterile, your skin needs to be as free of germs as possible.  You can reduce the number of germs on  your skin by washing with CHG (chlorahexidine gluconate) soap before surgery.  CHG is an antiseptic cleaner which kills germs and bonds with the skin to continue killing germs even after washing. Please DO NOT use if you have an allergy to CHG or antibacterial soaps.  If your skin becomes reddened/irritated stop using the CHG and inform your nurse when you arrive at Short Stay. Do not shave (including legs and underarms) for at least 48 hours prior to the first CHG shower.  You may shave your face/neck. Please follow these instructions carefully:  1.  Shower with CHG Soap  the night before surgery and the  morning of Surgery.  2.  If you choose to wash your hair, wash your hair first as usual with your  normal  shampoo.  3.  After you shampoo, rinse your hair and body thoroughly to remove the  shampoo.                           4.  Use CHG as you would any other liquid soap.  You can apply chg directly  to the skin and wash                       Gently with a scrungie or clean washcloth.  5.  Apply the CHG Soap to your body ONLY FROM THE NECK DOWN.   Do not use on face/ open                           Wound or open sores. Avoid contact with eyes, ears mouth and genitals (private parts).                       Wash face,  Genitals (private parts) with your normal soap.             6.  Wash thoroughly, paying special attention to the area where your surgery  will be performed.  7.  Thoroughly rinse your body with warm water from the neck down.  8.  DO NOT shower/wash with your normal soap after using and rinsing off  the CHG Soap.                9.  Pat yourself dry with a clean towel.            10.  Wear clean pajamas.            11.  Place clean sheets on your bed the night of your first shower and do not  sleep with pets. Day of Surgery : Do not apply any lotions/deodorants the morning of surgery.  Please wear clean clothes to the hospital/surgery center.  FAILURE TO FOLLOW THESE INSTRUCTIONS MAY  RESULT IN THE CANCELLATION OF YOUR SURGERY PATIENT SIGNATURE_________________________________  NURSE SIGNATURE__________________________________  ________________________________________________________________________

## 2019-02-26 NOTE — Progress Notes (Addendum)
PCP -Annye Asa  Cardiologist -   Chest x-ray -  EKG -  Stress Test -  ECHO -  Cardiac Cath -   Sleep Study -  CPAP -   Fasting Blood Sugar -  Checks Blood Sugar _____ times a day  Blood Thinner Instructions: Aspirin Instructions: Last Dose:  Anesthesia review:   Patient denies shortness of breath, fever, cough and chest pain at PAT appointment   Patient verbalized understanding of instructions that were given to them at the PAT appointment. Patient was also instructed that they will need to review over the PAT instructions again at home before surgery.

## 2019-02-27 ENCOUNTER — Encounter (HOSPITAL_COMMUNITY): Payer: Self-pay

## 2019-02-27 ENCOUNTER — Other Ambulatory Visit: Payer: Self-pay

## 2019-02-27 ENCOUNTER — Encounter (HOSPITAL_COMMUNITY)
Admission: RE | Admit: 2019-02-27 | Discharge: 2019-02-27 | Disposition: A | Discharge status: Home / Self Care | Payer: 59 | Source: Ambulatory Visit | Attending: Obstetrics & Gynecology | Admitting: Obstetrics & Gynecology

## 2019-02-27 DIAGNOSIS — Z01818 Encounter for other preprocedural examination: Secondary | ICD-10-CM | POA: Diagnosis not present

## 2019-02-28 ENCOUNTER — Encounter (HOSPITAL_COMMUNITY)
Admission: RE | Admit: 2019-02-28 | Discharge: 2019-02-28 | Disposition: A | Discharge status: Home / Self Care | Payer: 59 | Source: Ambulatory Visit | Attending: Obstetrics & Gynecology | Admitting: Obstetrics & Gynecology

## 2019-02-28 DIAGNOSIS — Z01818 Encounter for other preprocedural examination: Secondary | ICD-10-CM | POA: Diagnosis not present

## 2019-02-28 LAB — CBC
HCT: 40.2 % (ref 36.0–46.0)
Hemoglobin: 12.8 g/dL (ref 12.0–15.0)
MCH: 27.5 pg (ref 26.0–34.0)
MCHC: 31.8 g/dL (ref 30.0–36.0)
MCV: 86.3 fL (ref 80.0–100.0)
Platelets: 277 10*3/uL (ref 150–400)
RBC: 4.66 MIL/uL (ref 3.87–5.11)
RDW: 14 % (ref 11.5–15.5)
WBC: 6.9 10*3/uL (ref 4.0–10.5)
nRBC: 0 % (ref 0.0–0.2)

## 2019-02-28 LAB — BASIC METABOLIC PANEL
Anion gap: 9 (ref 5–15)
BUN: 23 mg/dL — ABNORMAL HIGH (ref 6–20)
CO2: 27 mmol/L (ref 22–32)
Calcium: 9.2 mg/dL (ref 8.9–10.3)
Chloride: 103 mmol/L (ref 98–111)
Creatinine, Ser: 0.82 mg/dL (ref 0.44–1.00)
GFR calc Af Amer: >60 mL/min (ref >60)
GFR calc non Af Amer: >60 mL/min (ref >60)
Glucose, Bld: 93 mg/dL (ref 70–99)
Potassium: 3.4 mmol/L — ABNORMAL LOW (ref 3.5–5.1)
Sodium: 139 mmol/L (ref 135–145)

## 2019-03-01 ENCOUNTER — Other Ambulatory Visit (HOSPITAL_COMMUNITY)
Admission: RE | Admit: 2019-03-01 | Discharge: 2019-03-01 | Disposition: A | Discharge status: Home / Self Care | Payer: 59 | Source: Ambulatory Visit | Attending: Obstetrics & Gynecology | Admitting: Obstetrics & Gynecology

## 2019-03-01 DIAGNOSIS — Z20822 Contact with and (suspected) exposure to covid-19: Secondary | ICD-10-CM | POA: Insufficient documentation

## 2019-03-01 DIAGNOSIS — Z01812 Encounter for preprocedural laboratory examination: Secondary | ICD-10-CM | POA: Diagnosis not present

## 2019-03-01 LAB — SARS CORONAVIRUS 2 (TAT 6-24 HRS): SARS Coronavirus 2: NEGATIVE

## 2019-03-04 MED ORDER — CLINDAMYCIN PHOSPHATE 900 MG/50ML IV SOLN
900.0000 mg | INTRAVENOUS | Status: AC
  Administered 2019-03-05: 900 mg via INTRAVENOUS
  Filled 2019-03-04: qty 50

## 2019-03-04 MED ORDER — GENTAMICIN SULFATE 40 MG/ML IJ SOLN
5.0000 mg/kg | INTRAVENOUS | Status: AC
  Administered 2019-03-05: 330 mg via INTRAVENOUS
  Filled 2019-03-04 (×2): qty 8.25

## 2019-03-04 NOTE — Anesthesia Preprocedure Evaluation (Addendum)
Anesthesia Evaluation  Patient identified by MRN, date of birth, ID band Patient awake    Reviewed: Allergy & Precautions, NPO status , Patient's Chart, lab work & pertinent test results  Airway Mallampati: II  TM Distance: >3 FB Neck ROM: Full    Dental no notable dental hx. (+) Teeth Intact, Dental Advisory Given   Pulmonary neg pulmonary ROS,    Pulmonary exam normal breath sounds clear to auscultation       Cardiovascular hypertension, Pt. on medications Normal cardiovascular exam Rhythm:Regular Rate:Normal     Neuro/Psych negative neurological ROS  negative psych ROS   GI/Hepatic GERD  Medicated,  Endo/Other  negative endocrine ROS  Renal/GU K+ 3.4 Cr 0.82     Musculoskeletal   Abdominal (+) + obese,   Peds  Hematology hgb 12.8 Plt 277   Anesthesia Other Findings   Reproductive/Obstetrics                           Anesthesia Physical Anesthesia Plan  ASA: II  Anesthesia Plan: General   Post-op Pain Management:    Induction: Intravenous  PONV Risk Score and Plan: 4 or greater and Treatment may vary due to age or medical condition, Ondansetron, Dexamethasone and Midazolam  Airway Management Planned: Oral ETT  Additional Equipment: None  Intra-op Plan:   Post-operative Plan: Extubation in OR  Informed Consent: I have reviewed the patients History and Physical, chart, labs and discussed the procedure including the risks, benefits and alternatives for the proposed anesthesia with the patient or authorized representative who has indicated his/her understanding and acceptance.     Dental advisory given  Plan Discussed with:   Anesthesia Plan Comments: (GA w Lidocaine infusion +/- dexmedetomidine 0.3-0.4 mcg/kg)       Anesthesia Quick Evaluation

## 2019-03-05 ENCOUNTER — Ambulatory Visit (HOSPITAL_BASED_OUTPATIENT_CLINIC_OR_DEPARTMENT_OTHER)
Admission: RE | Admit: 2019-03-05 | Discharge: 2019-03-05 | Disposition: A | Discharge status: Home / Self Care | Payer: 59 | Attending: Obstetrics & Gynecology | Admitting: Obstetrics & Gynecology

## 2019-03-05 ENCOUNTER — Ambulatory Visit (HOSPITAL_BASED_OUTPATIENT_CLINIC_OR_DEPARTMENT_OTHER): Payer: 59 | Admitting: Certified Registered"

## 2019-03-05 ENCOUNTER — Encounter (HOSPITAL_BASED_OUTPATIENT_CLINIC_OR_DEPARTMENT_OTHER)
Admission: RE | Disposition: A | Discharge status: Home / Self Care | Payer: Self-pay | Source: Home / Self Care | Attending: Obstetrics & Gynecology

## 2019-03-05 ENCOUNTER — Ambulatory Visit (HOSPITAL_BASED_OUTPATIENT_CLINIC_OR_DEPARTMENT_OTHER): Payer: 59 | Admitting: Physician Assistant

## 2019-03-05 ENCOUNTER — Encounter (HOSPITAL_BASED_OUTPATIENT_CLINIC_OR_DEPARTMENT_OTHER): Payer: Self-pay | Admitting: Obstetrics & Gynecology

## 2019-03-05 ENCOUNTER — Other Ambulatory Visit: Payer: Self-pay

## 2019-03-05 DIAGNOSIS — F419 Anxiety disorder, unspecified: Secondary | ICD-10-CM | POA: Diagnosis present

## 2019-03-05 DIAGNOSIS — N920 Excessive and frequent menstruation with regular cycle: Secondary | ICD-10-CM | POA: Diagnosis not present

## 2019-03-05 DIAGNOSIS — D250 Submucous leiomyoma of uterus: Secondary | ICD-10-CM

## 2019-03-05 DIAGNOSIS — D259 Leiomyoma of uterus, unspecified: Secondary | ICD-10-CM | POA: Diagnosis not present

## 2019-03-05 DIAGNOSIS — Z79899 Other long term (current) drug therapy: Secondary | ICD-10-CM | POA: Diagnosis not present

## 2019-03-05 DIAGNOSIS — N72 Inflammatory disease of cervix uteri: Secondary | ICD-10-CM | POA: Insufficient documentation

## 2019-03-05 DIAGNOSIS — I10 Essential (primary) hypertension: Secondary | ICD-10-CM | POA: Diagnosis not present

## 2019-03-05 DIAGNOSIS — D252 Subserosal leiomyoma of uterus: Secondary | ICD-10-CM

## 2019-03-05 DIAGNOSIS — Z9889 Other specified postprocedural states: Secondary | ICD-10-CM

## 2019-03-05 HISTORY — PX: CYSTOSCOPY: SHX5120

## 2019-03-05 HISTORY — PX: ROBOTIC ASSISTED LAPAROSCOPIC HYSTERECTOMY AND SALPINGECTOMY: SHX6379

## 2019-03-05 LAB — POCT PREGNANCY, URINE: Preg Test, Ur: NEGATIVE

## 2019-03-05 SURGERY — XI ROBOTIC ASSISTED LAPAROSCOPIC HYSTERECTOMY AND SALPINGECTOMY
Anesthesia: General | Site: Ureter | Laterality: Bilateral

## 2019-03-05 MED ORDER — LIDOCAINE 2% (20 MG/ML) 5 ML SYRINGE
INTRAMUSCULAR | Status: DC | PRN
  Administered 2019-03-05: 100 mg via INTRAVENOUS

## 2019-03-05 MED ORDER — ONDANSETRON HCL 4 MG/2ML IJ SOLN
INTRAMUSCULAR | Status: DC | PRN
  Administered 2019-03-05: 4 mg via INTRAVENOUS

## 2019-03-05 MED ORDER — GABAPENTIN 300 MG PO CAPS
300.0000 mg | ORAL_CAPSULE | ORAL | Status: AC
  Administered 2019-03-05: 300 mg via ORAL
  Filled 2019-03-05: qty 1

## 2019-03-05 MED ORDER — OXYCODONE-ACETAMINOPHEN 5-325 MG PO TABS: 1.0000 | ORAL_TABLET | Freq: Four times a day (QID) | ORAL | 0 refills | Status: DC | PRN

## 2019-03-05 MED ORDER — ROCURONIUM BROMIDE 10 MG/ML (PF) SYRINGE
PREFILLED_SYRINGE | INTRAVENOUS | Status: AC
  Filled 2019-03-05: qty 10

## 2019-03-05 MED ORDER — SOD CITRATE-CITRIC ACID 500-334 MG/5ML PO SOLN
30.0000 mL | ORAL | Status: DC
  Filled 2019-03-05: qty 30

## 2019-03-05 MED ORDER — LACTATED RINGERS IV SOLN
INTRAVENOUS | Status: DC
  Filled 2019-03-05: qty 1000

## 2019-03-05 MED ORDER — SIMETHICONE 80 MG PO CHEW
80.0000 mg | CHEWABLE_TABLET | Freq: Four times a day (QID) | ORAL | Status: DC | PRN
  Administered 2019-03-05: 80 mg via ORAL
  Filled 2019-03-05: qty 1

## 2019-03-05 MED ORDER — ONDANSETRON HCL 4 MG/2ML IJ SOLN
4.0000 mg | Freq: Four times a day (QID) | INTRAMUSCULAR | Status: DC | PRN
  Filled 2019-03-05: qty 2

## 2019-03-05 MED ORDER — HYDROMORPHONE HCL 2 MG/ML IJ SOLN
INTRAMUSCULAR | Status: AC
  Filled 2019-03-05: qty 1

## 2019-03-05 MED ORDER — LIDOCAINE 2% (20 MG/ML) 5 ML SYRINGE
INTRAMUSCULAR | Status: AC
  Filled 2019-03-05: qty 5

## 2019-03-05 MED ORDER — ZOLPIDEM TARTRATE 5 MG PO TABS
5.0000 mg | ORAL_TABLET | Freq: Every evening | ORAL | Status: DC | PRN
  Filled 2019-03-05: qty 1

## 2019-03-05 MED ORDER — SODIUM CHLORIDE 0.9 % IR SOLN
Status: DC | PRN
  Administered 2019-03-05: 1000 mL

## 2019-03-05 MED ORDER — FENTANYL CITRATE (PF) 100 MCG/2ML IJ SOLN
INTRAMUSCULAR | Status: AC
  Filled 2019-03-05: qty 2

## 2019-03-05 MED ORDER — PANTOPRAZOLE SODIUM 40 MG PO TBEC
40.0000 mg | DELAYED_RELEASE_TABLET | Freq: Every day | ORAL | Status: DC
  Filled 2019-03-05: qty 1

## 2019-03-05 MED ORDER — PROPOFOL 10 MG/ML IV BOLUS
INTRAVENOUS | Status: DC | PRN
  Administered 2019-03-05: 170 mg via INTRAVENOUS

## 2019-03-05 MED ORDER — EPHEDRINE SULFATE-NACL 50-0.9 MG/10ML-% IV SOSY
PREFILLED_SYRINGE | INTRAVENOUS | Status: DC | PRN
  Administered 2019-03-05 (×3): 5 mg via INTRAVENOUS

## 2019-03-05 MED ORDER — MENTHOL 3 MG MT LOZG
1.0000 | LOZENGE | OROMUCOSAL | Status: DC | PRN
  Filled 2019-03-05: qty 9

## 2019-03-05 MED ORDER — OXYCODONE-ACETAMINOPHEN 5-325 MG PO TABS
1.0000 | ORAL_TABLET | ORAL | Status: DC | PRN
  Administered 2019-03-05: 1 via ORAL
  Filled 2019-03-05: qty 2

## 2019-03-05 MED ORDER — KETOROLAC TROMETHAMINE 30 MG/ML IJ SOLN
INTRAMUSCULAR | Status: AC
  Filled 2019-03-05: qty 1

## 2019-03-05 MED ORDER — ONDANSETRON HCL 4 MG/2ML IJ SOLN
4.0000 mg | Freq: Once | INTRAMUSCULAR | Status: DC | PRN
  Filled 2019-03-05: qty 2

## 2019-03-05 MED ORDER — MIDAZOLAM HCL 2 MG/2ML IJ SOLN
INTRAMUSCULAR | Status: DC | PRN
  Administered 2019-03-05: 2 mg via INTRAVENOUS

## 2019-03-05 MED ORDER — FENTANYL CITRATE (PF) 250 MCG/5ML IJ SOLN
INTRAMUSCULAR | Status: DC | PRN
  Administered 2019-03-05 (×2): 50 ug via INTRAVENOUS

## 2019-03-05 MED ORDER — OXYCODONE HCL 5 MG PO TABS
5.0000 mg | ORAL_TABLET | Freq: Once | ORAL | Status: DC | PRN
  Filled 2019-03-05: qty 1

## 2019-03-05 MED ORDER — CLINDAMYCIN PHOSPHATE 900 MG/50ML IV SOLN
INTRAVENOUS | Status: AC
  Filled 2019-03-05: qty 50

## 2019-03-05 MED ORDER — BUPIVACAINE HCL (PF) 0.5 % IJ SOLN
INTRAMUSCULAR | Status: DC | PRN
  Administered 2019-03-05: 30 mL

## 2019-03-05 MED ORDER — PROPOFOL 10 MG/ML IV BOLUS
INTRAVENOUS | Status: AC
  Filled 2019-03-05: qty 20

## 2019-03-05 MED ORDER — LIDOCAINE 2% (20 MG/ML) 5 ML SYRINGE
INTRAMUSCULAR | Status: AC
  Filled 2019-03-05: qty 10

## 2019-03-05 MED ORDER — OXYCODONE-ACETAMINOPHEN 5-325 MG PO TABS
ORAL_TABLET | ORAL | Status: AC
  Filled 2019-03-05: qty 1

## 2019-03-05 MED ORDER — DOCUSATE SODIUM 100 MG PO CAPS: 100.0000 mg | ORAL_CAPSULE | Freq: Two times a day (BID) | ORAL | 0 refills | Status: DC | PRN

## 2019-03-05 MED ORDER — SIMETHICONE 80 MG PO CHEW
CHEWABLE_TABLET | ORAL | Status: AC
  Filled 2019-03-05: qty 1

## 2019-03-05 MED ORDER — SUGAMMADEX SODIUM 200 MG/2ML IV SOLN
INTRAVENOUS | Status: DC | PRN
  Administered 2019-03-05: 200 mg via INTRAVENOUS

## 2019-03-05 MED ORDER — IBUPROFEN 800 MG PO TABS: 800.0000 mg | ORAL_TABLET | Freq: Three times a day (TID) | ORAL | 0 refills | Status: DC | PRN

## 2019-03-05 MED ORDER — DEXTROSE-NACL 5-0.45 % IV SOLN
INTRAVENOUS | Status: AC
  Filled 2019-03-05: qty 1000

## 2019-03-05 MED ORDER — POLYETHYLENE GLYCOL 3350 17 G PO PACK
17.0000 g | PACK | Freq: Every day | ORAL | Status: DC | PRN
  Filled 2019-03-05: qty 1

## 2019-03-05 MED ORDER — LIDOCAINE 20MG/ML (2%) 15 ML SYRINGE OPTIME
INTRAMUSCULAR | Status: DC | PRN
  Administered 2019-03-05: 1.5 mg/kg/h via INTRAVENOUS

## 2019-03-05 MED ORDER — DEXAMETHASONE SODIUM PHOSPHATE 10 MG/ML IJ SOLN
INTRAMUSCULAR | Status: AC
  Filled 2019-03-05: qty 1

## 2019-03-05 MED ORDER — GABAPENTIN 300 MG PO CAPS
ORAL_CAPSULE | ORAL | Status: AC
  Filled 2019-03-05: qty 1

## 2019-03-05 MED ORDER — HYDROMORPHONE HCL 1 MG/ML IJ SOLN
0.2000 mg | INTRAMUSCULAR | Status: DC | PRN
  Filled 2019-03-05: qty 1

## 2019-03-05 MED ORDER — EPHEDRINE 5 MG/ML INJ
INTRAVENOUS | Status: AC
  Filled 2019-03-05: qty 10

## 2019-03-05 MED ORDER — OXYCODONE HCL 5 MG/5ML PO SOLN
5.0000 mg | Freq: Once | ORAL | Status: DC | PRN
  Filled 2019-03-05: qty 5

## 2019-03-05 MED ORDER — DEXMEDETOMIDINE HCL IN NACL 200 MCG/50ML IV SOLN
INTRAVENOUS | Status: DC | PRN
  Administered 2019-03-05: 8 ug via INTRAVENOUS
  Administered 2019-03-05: 12 ug via INTRAVENOUS

## 2019-03-05 MED ORDER — FLUORESCEIN SODIUM 10 % IV SOLN
INTRAVENOUS | Status: AC
  Filled 2019-03-05: qty 5

## 2019-03-05 MED ORDER — DEXAMETHASONE SODIUM PHOSPHATE 10 MG/ML IJ SOLN
INTRAMUSCULAR | Status: DC | PRN
  Administered 2019-03-05: 8 mg via INTRAVENOUS

## 2019-03-05 MED ORDER — KETOROLAC TROMETHAMINE 30 MG/ML IJ SOLN
30.0000 mg | Freq: Once | INTRAMUSCULAR | Status: DC
  Filled 2019-03-05: qty 1

## 2019-03-05 MED ORDER — KETOROLAC TROMETHAMINE 30 MG/ML IJ SOLN
30.0000 mg | Freq: Once | INTRAMUSCULAR | Status: DC | PRN
  Filled 2019-03-05: qty 1

## 2019-03-05 MED ORDER — ONDANSETRON HCL 4 MG PO TABS
4.0000 mg | ORAL_TABLET | Freq: Four times a day (QID) | ORAL | Status: DC | PRN
  Filled 2019-03-05: qty 1

## 2019-03-05 MED ORDER — ACETAMINOPHEN 500 MG PO TABS
1000.0000 mg | ORAL_TABLET | ORAL | Status: AC
  Administered 2019-03-05: 1000 mg via ORAL
  Filled 2019-03-05: qty 2

## 2019-03-05 MED ORDER — HYDROMORPHONE HCL 1 MG/ML IJ SOLN
INTRAMUSCULAR | Status: AC
  Filled 2019-03-05: qty 1

## 2019-03-05 MED ORDER — KETOROLAC TROMETHAMINE 30 MG/ML IJ SOLN
30.0000 mg | Freq: Four times a day (QID) | INTRAMUSCULAR | Status: DC
  Administered 2019-03-05: 30 mg via INTRAVENOUS
  Filled 2019-03-05: qty 1

## 2019-03-05 MED ORDER — FLUORESCEIN SODIUM 10 % IV SOLN
INTRAVENOUS | Status: DC | PRN
  Administered 2019-03-05: 1 mL via INTRAVENOUS

## 2019-03-05 MED ORDER — DEXMEDETOMIDINE HCL IN NACL 200 MCG/50ML IV SOLN
INTRAVENOUS | Status: AC
  Filled 2019-03-05: qty 50

## 2019-03-05 MED ORDER — FENTANYL CITRATE (PF) 100 MCG/2ML IJ SOLN
25.0000 ug | INTRAMUSCULAR | Status: DC | PRN
  Filled 2019-03-05: qty 1

## 2019-03-05 MED ORDER — IBUPROFEN 800 MG PO TABS
800.0000 mg | ORAL_TABLET | Freq: Four times a day (QID) | ORAL | Status: DC
  Filled 2019-03-05: qty 1

## 2019-03-05 MED ORDER — DOCUSATE SODIUM 100 MG PO CAPS
100.0000 mg | ORAL_CAPSULE | Freq: Two times a day (BID) | ORAL | Status: DC
  Filled 2019-03-05: qty 1

## 2019-03-05 MED ORDER — BISACODYL 10 MG RE SUPP
10.0000 mg | Freq: Every day | RECTAL | Status: DC | PRN
  Filled 2019-03-05: qty 1

## 2019-03-05 MED ORDER — HYDROMORPHONE HCL 1 MG/ML IJ SOLN
INTRAMUSCULAR | Status: DC | PRN
  Administered 2019-03-05 (×2): .5 mg via INTRAVENOUS

## 2019-03-05 MED ORDER — MIDAZOLAM HCL 2 MG/2ML IJ SOLN
INTRAMUSCULAR | Status: AC
  Filled 2019-03-05: qty 2

## 2019-03-05 MED ORDER — ROCURONIUM BROMIDE 10 MG/ML (PF) SYRINGE
PREFILLED_SYRINGE | INTRAVENOUS | Status: DC | PRN
  Administered 2019-03-05: 50 mg via INTRAVENOUS
  Administered 2019-03-05: 20 mg via INTRAVENOUS
  Administered 2019-03-05: 10 mg via INTRAVENOUS
  Administered 2019-03-05: 20 mg via INTRAVENOUS

## 2019-03-05 MED ORDER — KETOROLAC TROMETHAMINE 30 MG/ML IJ SOLN
INTRAMUSCULAR | Status: DC | PRN
  Administered 2019-03-05: 30 mg via INTRAVENOUS

## 2019-03-05 MED ORDER — SODIUM CHLORIDE 0.9 % IR SOLN
Status: DC | PRN
  Administered 2019-03-05: 3000 mL

## 2019-03-05 MED ORDER — ACETAMINOPHEN 500 MG PO TABS
ORAL_TABLET | ORAL | Status: AC
  Filled 2019-03-05: qty 2

## 2019-03-05 MED ORDER — ONDANSETRON HCL 4 MG/2ML IJ SOLN
INTRAMUSCULAR | Status: AC
  Filled 2019-03-05: qty 2

## 2019-03-05 SURGICAL SUPPLY — 59 items
ADH SKN CLS APL DERMABOND .7 (GAUZE/BANDAGES/DRESSINGS) ×2
APPLICATOR COTTON TIP 6IN NSTR (MISCELLANEOUS) ×1 IMPLANT
CATH FOLEY 3WAY  5CC 16FR (CATHETERS) ×1
CATH FOLEY 3WAY 5CC 16FR (CATHETERS) ×2 IMPLANT
COVER BACK TABLE 60X90IN (DRAPES) ×3 IMPLANT
COVER TIP SHEARS 8 DVNC (MISCELLANEOUS) ×2 IMPLANT
COVER TIP SHEARS 8MM DA VINCI (MISCELLANEOUS) ×1
DECANTER SPIKE VIAL GLASS SM (MISCELLANEOUS) ×3 IMPLANT
DEFOGGER SCOPE WARMER CLEARIFY (MISCELLANEOUS) ×3 IMPLANT
DERMABOND ADVANCED (GAUZE/BANDAGES/DRESSINGS) ×1
DERMABOND ADVANCED .7 DNX12 (GAUZE/BANDAGES/DRESSINGS) ×2 IMPLANT
DRAPE ARM DVNC X/XI (DISPOSABLE) ×6 IMPLANT
DRAPE COLUMN DVNC XI (DISPOSABLE) ×2 IMPLANT
DRAPE DA VINCI XI ARM (DISPOSABLE) ×3
DRAPE DA VINCI XI COLUMN (DISPOSABLE) ×1
DURAPREP 26ML APPLICATOR (WOUND CARE) ×3 IMPLANT
ELECT REM PT RETURN 9FT ADLT (ELECTROSURGICAL) ×3
ELECTRODE REM PT RTRN 9FT ADLT (ELECTROSURGICAL) ×2 IMPLANT
GLOVE BIO SURGEON STRL SZ 6.5 (GLOVE) ×10 IMPLANT
GLOVE BIO SURGEON STRL SZ7 (GLOVE) ×5 IMPLANT
GLOVE BIO SURGEON STRL SZ7.5 (GLOVE) ×1 IMPLANT
GLOVE BIOGEL PI IND STRL 7.0 (GLOVE) ×6 IMPLANT
GLOVE BIOGEL PI IND STRL 7.5 (GLOVE) IMPLANT
GLOVE BIOGEL PI INDICATOR 7.0 (GLOVE) ×8
GLOVE BIOGEL PI INDICATOR 7.5 (GLOVE) ×1
HOLDER FOLEY CATH W/STRAP (MISCELLANEOUS) ×1 IMPLANT
IRRIG SUCT STRYKERFLOW 2 WTIP (MISCELLANEOUS) ×3
IRRIGATION SUCT STRKRFLW 2 WTP (MISCELLANEOUS) ×2 IMPLANT
LEGGING LITHOTOMY PAIR STRL (DRAPES) ×3 IMPLANT
MANIFOLD NEPTUNE II (INSTRUMENTS) ×1 IMPLANT
MANIPULATOR ADVINCU DEL 3.0 PL (MISCELLANEOUS) ×1 IMPLANT
NEEDLE INSUFFLATION 120MM (ENDOMECHANICALS) ×3 IMPLANT
OBTURATOR OPTICAL STANDARD 8MM (TROCAR) ×1
OBTURATOR OPTICAL STND 8 DVNC (TROCAR) ×2
OBTURATOR OPTICALSTD 8 DVNC (TROCAR) ×2 IMPLANT
OCCLUDER COLPOPNEUMO (BALLOONS) ×3 IMPLANT
PACK ROBOT WH (CUSTOM PROCEDURE TRAY) ×3 IMPLANT
PACK ROBOTIC GOWN (GOWN DISPOSABLE) ×3 IMPLANT
PACK TRENDGUARD 450 HYBRID PRO (MISCELLANEOUS) IMPLANT
PAD PREP 24X48 CUFFED NSTRL (MISCELLANEOUS) ×3 IMPLANT
PROTECTOR NERVE ULNAR (MISCELLANEOUS) ×6 IMPLANT
SEAL CANN UNIV 5-8 DVNC XI (MISCELLANEOUS) ×6 IMPLANT
SEAL XI 5MM-8MM UNIVERSAL (MISCELLANEOUS) ×3
SEALER VESSEL DA VINCI XI (MISCELLANEOUS) ×1
SEALER VESSEL EXT DVNC XI (MISCELLANEOUS) ×2 IMPLANT
SET IRRIG Y TYPE TUR BLADDER L (SET/KITS/TRAYS/PACK) ×3 IMPLANT
SET TRI-LUMEN FLTR TB AIRSEAL (TUBING) ×3 IMPLANT
SUT VIC AB 0 CT1 27 (SUTURE) ×6
SUT VIC AB 0 CT1 27XBRD ANBCTR (SUTURE) ×4 IMPLANT
SUT VIC AB 4-0 PS2 18 (SUTURE) ×2 IMPLANT
SUT VICRYL 4-0 PS2 18IN ABS (SUTURE) ×6 IMPLANT
SUT VLOC 180 0 9IN  GS21 (SUTURE) ×1
SUT VLOC 180 0 9IN GS21 (SUTURE) ×2 IMPLANT
TIP UTERINE 6.7X10CM GRN DISP (MISCELLANEOUS) ×1 IMPLANT
TOWEL OR 17X26 10 PK STRL BLUE (TOWEL DISPOSABLE) ×4 IMPLANT
TRENDGUARD 450 HYBRID PRO PACK (MISCELLANEOUS) ×3
TROCAR BLADELESS OPT 5 100 (ENDOMECHANICALS) ×1 IMPLANT
TROCAR PORT AIRSEAL 5X120 (TROCAR) ×3 IMPLANT
WATER STERILE IRR 1000ML POUR (IV SOLUTION) ×3 IMPLANT

## 2019-03-05 NOTE — H&P (Signed)
Preoperative History and Physical  Angela Morris is a 49 y.o. G2P2000 here for surgical management of AUB and fibroids.   Proposed surgery: Robot assisted total laparoscopic hysterectomy with bilateral salpingectomy   Past Medical History:  Diagnosis Date  . Anxiety   . Depression   . Hypertension   . Shingles    Past Surgical History:  Procedure Laterality Date  . TUBAL LIGATION  2002   OB History    Gravida  2   Para  2   Term  2   Preterm      AB      Living        SAB      TAB      Ectopic      Multiple      Live Births             Patient denies any cervical dysplasia or STIs. Medications Prior to Admission  Medication Sig Dispense Refill Last Dose  . hydrochlorothiazide (HYDRODIURIL) 25 MG tablet Take 1 tablet (25 mg total) by mouth daily. 30 tablet 3 03/05/2019 at 0830  . LORazepam (ATIVAN) 0.5 MG tablet TAKE 1/2 TO 1 TABLET BY MOUTH AS NEEDED FOR SEVERE ANXIETY (Patient taking differently: Take 0.25-0.5 mg by mouth daily as needed for anxiety. ) 30 tablet 0 Past Month at Unknown time  . cetirizine (ZYRTEC) 10 MG tablet Take 1 tablet (10 mg total) by mouth daily. (Patient taking differently: Take 10 mg by mouth daily as needed for allergies. ) 30 tablet 11 More than a month at Unknown time  . clotrimazole-betamethasone (LOTRISONE) cream Apply 1 application topically 2 (two) times daily. (Patient not taking: Reported on 11/28/2018) 45 g 3   . famotidine (PEPCID) 10 MG tablet Take 10 mg by mouth 2 (two) times daily.   More than a month at Unknown time  . valACYclovir (VALTREX) 1000 MG tablet Take 1 tablet (1,000 mg total) by mouth 3 (three) times daily. (Patient not taking: Reported on 07/07/2017) 21 tablet 0   . Vitamin D, Ergocalciferol, (DRISDOL) 1.25 MG (50000 UT) CAPS capsule Take 1 capsule (50,000 Units total) by mouth every 7 (seven) days. (Patient not taking: Reported on 02/21/2019) 12 capsule 0 Not Taking at Unknown time    Allergies   Allergen Reactions  . Penicillins Hives    Did it involve swelling of the face/tongue/throat, SOB, or low BP? No Did it involve sudden or severe rash/hives, skin peeling, or any reaction on the inside of your mouth or nose? No Did you need to seek medical attention at a hospital or doctor's office? No When did it last happen?20's If all above answers are "NO", may proceed with cephalosporin use.   . Sulfonamide Derivatives Shortness Of Breath   Social History:   reports that she has never smoked. She has never used smokeless tobacco. She reports current alcohol use. She reports previous drug use. Family History  Problem Relation Age of Onset  . Hypertension Mother   . Hypertension Father   . Stroke Father   . Coronary artery disease Other        female relative <50  . Breast cancer Neg Hx     Review of Systems: Noncontributory  PHYSICAL EXAM: Blood pressure (!) 142/104, pulse 86, temperature 98 F (36.7 C), temperature source Oral, resp. rate 16, height 5\' 4"  (1.626 m), weight 82.2 kg, last menstrual period 02/15/2019, SpO2 (!) 10 %. General appearance - alert, well appearing, and in no  distress Chest - clear to auscultation, no wheezes, rales or rhonchi, symmetric air entry Heart - normal rate and regular rhythm Abdomen - soft, nontender, nondistended, no masses or organomegaly Pelvic - examination not indicated Extremities - peripheral pulses normal, no pedal edema, no clubbing or cyanosis  Labs: Results for orders placed or performed during the hospital encounter of 03/05/19 (from the past 336 hour(s))  Pregnancy, urine POC   Collection Time: 03/05/19  9:09 AM  Result Value Ref Range   Preg Test, Ur NEGATIVE NEGATIVE  Results for orders placed or performed during the hospital encounter of 03/01/19 (from the past 336 hour(s))  SARS CORONAVIRUS 2 (TAT 6-24 HRS) Nasopharyngeal Nasopharyngeal Swab   Collection Time: 03/01/19  3:13 PM   Specimen: Nasopharyngeal Swab   Result Value Ref Range   SARS Coronavirus 2 NEGATIVE NEGATIVE  Results for orders placed or performed during the hospital encounter of 02/28/19 (from the past 336 hour(s))  Basic metabolic panel   Collection Time: 02/28/19  8:29 AM  Result Value Ref Range   Sodium 139 135 - 145 mmol/L   Potassium 3.4 (L) 3.5 - 5.1 mmol/L   Chloride 103 98 - 111 mmol/L   CO2 27 22 - 32 mmol/L   Glucose, Bld 93 70 - 99 mg/dL   BUN 23 (H) 6 - 20 mg/dL   Creatinine, Ser 0.82 0.44 - 1.00 mg/dL   Calcium 9.2 8.9 - 10.3 mg/dL   GFR calc non Af Amer >60 >60 mL/min   GFR calc Af Amer >60 >60 mL/min   Anion gap 9 5 - 15  CBC   Collection Time: 02/28/19  8:29 AM  Result Value Ref Range   WBC 6.9 4.0 - 10.5 K/uL   RBC 4.66 3.87 - 5.11 MIL/uL   Hemoglobin 12.8 12.0 - 15.0 g/dL   HCT 40.2 36.0 - 46.0 %   MCV 86.3 80.0 - 100.0 fL   MCH 27.5 26.0 - 34.0 pg   MCHC 31.8 30.0 - 36.0 g/dL   RDW 14.0 11.5 - 15.5 %   Platelets 277 150 - 400 K/uL   nRBC 0.0 0.0 - 0.2 %  Type and screen [place]   Collection Time: 02/28/19  8:29 AM  Result Value Ref Range   ABO/RH(D) O POS    Antibody Screen POS    Sample Expiration 03/05/2019,2359    Antibody Identification ANTI LEA Bobby Rumpf a)    Unit Number [id number]    Blood Component Type RED CELLS,LR    Unit division 00    Status of Unit ALLOCATED    Transfusion Status OK TO TRANSFUSE    Crossmatch Result COMPATIBLE    Unit Number [id number]    Blood Component Type RED CELLS,LR    Unit division 00    Status of Unit ALLOCATED    Transfusion Status OK TO TRANSFUSE    Crossmatch Result COMPATIBLE   BPAM RBC   Collection Time: 02/28/19  8:29 AM  Result Value Ref Range   Blood Product Unit Number [id number]    PRODUCT CODE E0382V00    Unit Type and Rh 5100    Blood Product Expiration Date [id number]    Blood Product Unit Number [id number]    PRODUCT CODE [id number]    Unit Type and Rh 5100    Blood Product Expiration Date  [id number]     Imaging Studies: No results found.  Assessment: Patient Active Problem List   Diagnosis Date Noted  . Post-operative state 03/05/2019  .  Fibroid uterus 12/03/2018  . Vitamin D deficiency 11/29/2016  . Vaginal discharge 11/03/2014  . Screening for malignant neoplasm of cervix 12/27/2011  . Routine general medical examination at a health care facility 12/27/2011  . Eczema 10/26/2011  . Excessive or frequent menstruation 09/07/2009  . ANA POSITIVE 08/24/2007  . Anxiety 06/07/2006  . Essential hypertension 06/07/2006    Plan: Patient will undergo surgical management with Robot assisted total laparoscopic hysterectomy with bilateral salpingectomy.   The risks of surgery were discussed in detail with the patient including but not limited to: bleeding which may require transfusion or reoperation; infection which may require antibiotics; injury to surrounding organs which may involve bowel, bladder, ureters ; need for additional procedures including laparoscopy or laparotomy; thromboembolic phenomenon, surgical site problems and other postoperative/anesthesia complications. Likelihood of success in alleviating the patient's condition was discussed. Routine postoperative instructions will be reviewed with the patient and her family in detail after surgery.  The patient concurred with the proposed plan, giving informed written consent for the surgery.  Patient has been NPO since last night she will remain NPO for procedure.  Anesthesia and OR aware.  Preoperative prophylactic antibiotics and SCDs ordered on call to the OR.  To OR when ready.  Noemi Bellissimo L. Ihor Dow, M.D., [facility] 03/05/2019 10:31 AM

## 2019-03-05 NOTE — Transfer of Care (Signed)
Immediate Anesthesia Transfer of Care Note  Patient: Angela Morris  Procedure(s) Performed: XI ROBOTIC ASSISTED LAPAROSCOPIC HYSTERECTOMY AND SALPINGECTOMY (Bilateral Abdomen) CYSTOSCOPY (Bilateral Ureter)  Patient Location: PACU  Anesthesia Type:General  Level of Consciousness: awake, alert  and oriented  Airway & Oxygen Therapy: Patient Spontanous Breathing and Patient connected to face mask oxygen  Post-op Assessment: Report given to RN and Post -op Vital signs reviewed and stable  Post vital signs: Reviewed and stable  Last Vitals:  Vitals Value Taken Time  BP 141/97 03/05/19 1401  Temp    Pulse 81 03/05/19 1405  Resp 16 03/05/19 1405  SpO2 100 % 03/05/19 1405  Vitals shown include unvalidated device data.  Last Pain:  Vitals:   03/05/19 0956  TempSrc: Oral  PainSc: 0-No pain      Patients Stated Pain Goal: 7 ([id number])  Complications: No apparent anesthesia complications

## 2019-03-05 NOTE — Anesthesia Procedure Notes (Signed)
Procedure Name: Intubation Date/Time: 03/05/2019 11:08 AM Performed by: Eben Burow, CRNA Pre-anesthesia Checklist: Patient identified, Emergency Drugs available, Suction available, Patient being monitored and Timeout performed Patient Re-evaluated:Patient Re-evaluated prior to induction Oxygen Delivery Method: Circle system utilized Preoxygenation: Pre-oxygenation with 100% oxygen Induction Type: IV induction Ventilation: Mask ventilation without difficulty Laryngoscope Size: Mac and 4 Grade View: Grade I Tube type: Oral Tube size: 7.0 mm Number of attempts: 1 Airway Equipment and Method: Stylet Placement Confirmation: ETT inserted through vocal cords under direct vision,  positive ETCO2 and breath sounds checked- equal and bilateral Secured at: 22 cm Tube secured with: Tape Dental Injury: Teeth and Oropharynx as per pre-operative assessment

## 2019-03-05 NOTE — Anesthesia Postprocedure Evaluation (Signed)
Anesthesia Post Note  Patient: [name]	[place]  Procedure(s) Performed: XI ROBOTIC ASSISTED LAPAROSCOPIC HYSTERECTOMY AND SALPINGECTOMY (Bilateral Abdomen) CYSTOSCOPY (Bilateral Ureter)     Patient location during evaluation: PACU Anesthesia Type: General Level of consciousness: awake and alert Pain management: pain level controlled Vital Signs Assessment: post-procedure vital signs reviewed and stable Respiratory status: spontaneous breathing, nonlabored ventilation, respiratory function stable and patient connected to nasal cannula oxygen Cardiovascular status: blood pressure returned to baseline and stable Postop Assessment: no apparent nausea or vomiting Anesthetic complications: no    Last Vitals:  Vitals:   03/05/19 1445 03/05/19 1515  BP: (!) 139/96 (!) 142/89  Pulse: 75 70  Resp: 15 14  Temp:    SpO2: 100% 100%    Last Pain:  Vitals:   03/05/19 1500  TempSrc:   PainSc: 5                  [place]

## 2019-03-05 NOTE — Discharge Instructions (Signed)

## 2019-03-05 NOTE — Brief Op Note (Signed)
03/05/2019  2:02 PM  PATIENT:  Angela Morris  49 y.o. female  PRE-OPERATIVE DIAGNOSIS:   pedunulated fibroid/ enlarged fibroid uterus  POST-OPERATIVE DIAGNOSIS:  pedunulated fibroid/ enlarged fibroid uterus  PROCEDURE:  Procedure(s): XI ROBOTIC ASSISTED LAPAROSCOPIC HYSTERECTOMY AND SALPINGECTOMY (Bilateral) CYSTOSCOPY (Bilateral)  SURGEON:  Surgeon(s) and Role:    * Lavonia Drafts, MD - Primary    * Anyanwu, Sallyanne Havers, MD - Assisting  ANESTHESIA:   general  EBL:  150 mL   BLOOD ADMINISTERED:none  DRAINS: none   LOCAL MEDICATIONS USED:  MARCAINE     SPECIMEN:  Source of Specimen:  uterus, fallopian tubes and cervix     DISPOSITION OF SPECIMEN:  PATHOLOGY  COUNTS:  YES  TOURNIQUET:  * No tourniquets in log *  DICTATION: .Note written in EPIC  PLAN OF CARE: prolonged recovery then discharge to home.  PATIENT DISPOSITION:  PACU - hemodynamically stable.   Delay start of Pharmacological VTE agent (>24hrs) due to surgical blood loss or risk of bleeding: yes  Complications: none immediate  Angela Morris, M.D., [facility]

## 2019-03-05 NOTE — Op Note (Signed)
03/05/2019  2:02 PM  PATIENT:  Angela Morris  49 y.o. female  PRE-OPERATIVE DIAGNOSIS:   pedunulated fibroid/ enlarged fibroid uterus  POST-OPERATIVE DIAGNOSIS:  pedunulated fibroid/ enlarged fibroid uterus  PROCEDURE:  Procedure(s): XI ROBOTIC ASSISTED LAPAROSCOPIC HYSTERECTOMY AND SALPINGECTOMY (Bilateral) CYSTOSCOPY (Bilateral)  SURGEON:  Surgeon(s) and Role:    * Lavonia Drafts, MD - Primary    * Anyanwu, Sallyanne Havers, MD - Assisting  ANESTHESIA:   general  EBL:  150 mL   BLOOD ADMINISTERED:none  DRAINS: none   LOCAL MEDICATIONS USED:  MARCAINE     SPECIMEN:  Source of Specimen:  uterus, fallopian tubes and cervix     DISPOSITION OF SPECIMEN:  PATHOLOGY  COUNTS:  YES  TOURNIQUET:  * No tourniquets in log *  DICTATION: .Note written in EPIC  PLAN OF CARE: prolonged recovery then discharge to home.  PATIENT DISPOSITION:  PACU - hemodynamically stable.   Delay start of Pharmacological VTE agent (>24hrs) due to surgical blood loss or risk of bleeding: yes  Complications: none immediate  Pt is a 49 yo with h/o abd pain; AUB and a large mobile pedunculated fibroid.   The risks, benefits, and alternatives of surgery were explained, understood, and accepted. Consents were signed. All questions were answered. She was taken to the operating room and general anesthesia was applied without complication. She was placed in the dorsal lithotomy position and her abdomen and vagina were prepped and draped after she had been carefully positioned on the table. A bimanual exam revealed a 12 week size uterus with pedunculated fibroid that was mobile. Her adnexa were not enlarged. The cervix was measured and the uterus was sounded to 10 cm. A Rumi uterine manipulator was placed without difficulty. A Foley catheter was placed and it drained clear throughout the case. Gloves were changed and attention was turned to the abdomen. A 17mm incision was made in the umbilicus and a  Optiview trocar was placed intraperitoneally. CO2 was used to insufflate the abdomen to approximately 4 L. A pneumoperitoneum was established. She was placed in Trendelenburg position and ports were placed in appropriate positions on her abdomen to allow maximum exposure during the robotic case. Specifically there was an 54mm assistant port placed in the left upper quadrant under direct laproscopic visualization. Two 8 mm ports were placed 8cm lateral and inferior to the midline port. A forth arm was placed in the right upper quadrant under direct visualization.  These were all placed under direct laparoscopic visualization. The robot was docked and I proceeded with a robotic portion of the case.  The pelvis was inspected and the uterus was found to have a very large pedunculated fiborid that was not adherent to any other tissue.  The fallopian tubes and ovaries were found to be normal. The remainder of her pelvis appeared normal with the exception of adhesions of bowel to the adnexa and sidewall on the left side.  These were released sharply. The ureters and the infundibulopelvic ligaments were identified. I excised the fallopian tubes bilaterally. The round ligament on each side was cauterized and cut. The Vessel sealer instrument  was used for this portion. The round ligaments were identified, cauterized and ligated, a bladder flap was created anteriorly. The uterine vessels were identified and cauterized and then cut.The bladder was pushed out of the operative site and an anterior colpotomy was made. The colpotomy incision was extended circumferentially, following the blue outline of the Rumi manipulator. All pedicles were hemostatic.  The uterus was  removed from the vagina with the fallopian tube segments. The pedunculated fibroid had to be morcellated from the vagina to remove. The vaginal cuff was closed with v-lock suture.  Excellent hemostasis was noted throughout. The pelvis was irrigated. The  intraabdominal pressure was lowered assess hemostasis. After determining excellent hemostasis, the robot was undocked. At this point I performed cystoscopy. The cystoscopy revealed blue ejection from both ureters.  The midline fascial incision was closed with 0 vicryl.  The skin from all of the other ports was closed with 3-0 vicryl. 30cc of 0.5% Marcaine was injected into the port sites.  The patient was then extubated and taken to recovery in stable condition.   Sponge, lap and needle counts were correct x 2.  An experienced assistant was required given the standard of surgical care given the complexity of the case.  This assistant was needed for exposure, dissection, suctioning, retraction, instrument exchange, and for overall help during the procedure.  Thimothy Barretta L. Harraway-Smith, M.D., [facility]

## 2019-03-06 ENCOUNTER — Emergency Department (HOSPITAL_BASED_OUTPATIENT_CLINIC_OR_DEPARTMENT_OTHER)
Admission: EM | Admit: 2019-03-06 | Discharge: 2019-03-07 | Disposition: A | Discharge status: Home / Self Care | Payer: 59 | Attending: Emergency Medicine | Admitting: Emergency Medicine

## 2019-03-06 ENCOUNTER — Other Ambulatory Visit: Payer: Self-pay

## 2019-03-06 DIAGNOSIS — M79604 Pain in right leg: Secondary | ICD-10-CM | POA: Insufficient documentation

## 2019-03-06 DIAGNOSIS — Z882 Allergy status to sulfonamides status: Secondary | ICD-10-CM | POA: Insufficient documentation

## 2019-03-06 DIAGNOSIS — E876 Hypokalemia: Secondary | ICD-10-CM | POA: Insufficient documentation

## 2019-03-06 DIAGNOSIS — I10 Essential (primary) hypertension: Secondary | ICD-10-CM | POA: Insufficient documentation

## 2019-03-06 DIAGNOSIS — Z880 Allergy status to penicillin: Secondary | ICD-10-CM | POA: Diagnosis not present

## 2019-03-06 DIAGNOSIS — Z79899 Other long term (current) drug therapy: Secondary | ICD-10-CM | POA: Insufficient documentation

## 2019-03-06 DIAGNOSIS — R0989 Other specified symptoms and signs involving the circulatory and respiratory systems: Secondary | ICD-10-CM

## 2019-03-06 DIAGNOSIS — R42 Dizziness and giddiness: Secondary | ICD-10-CM | POA: Diagnosis not present

## 2019-03-06 DIAGNOSIS — Z9889 Other specified postprocedural states: Secondary | ICD-10-CM | POA: Diagnosis not present

## 2019-03-06 LAB — TYPE AND SCREEN
ABO/RH(D): O POS
Antibody Screen: POSITIVE
Unit division: 0
Unit division: 0

## 2019-03-06 LAB — BPAM RBC
Blood Product Expiration Date: 202102242359
Blood Product Expiration Date: 202102242359
Unit Type and Rh: 5100
Unit Type and Rh: 5100

## 2019-03-06 MED ORDER — ENOXAPARIN SODIUM 80 MG/0.8ML ~~LOC~~ SOLN
1.0000 mg/kg | Freq: Once | SUBCUTANEOUS | Status: AC
  Administered 2019-03-06: 80 mg via SUBCUTANEOUS
  Filled 2019-03-06: qty 0.8

## 2019-03-06 NOTE — ED Triage Notes (Signed)
Pt c/o right leg pain and thigh pain x 5 hrs , post op hysterectomy x 1 day ago

## 2019-03-06 NOTE — ED Provider Notes (Addendum)
[facility] Provider Note: Angela Spurling, MD, FACEP  CSN: [id number] MRN: [id number] ARRIVAL: 03/06/19 at [place]: [place]  Leg Pain   HISTORY OF PRESENT ILLNESS  03/06/19 11:09 PM Angela Morris is a 49 y.o. female who had a laparoscopic hysterectomy yesterday by Dr. Ihor Dow.  She is here with about 5 to 6 hours of pain in her right lower extremity.  The pain began in her proximal, medial thigh and is now extending down to her right calf.  She rates the pain as an 8 out of 10, worse with palpation or movement.  There is some swelling of the right leg as well.  She is also having pain in her abdomen which she describes as gas and her abdomen remains mildly distended.  She denies chest pain or shortness of breath.   Past Medical History:  Diagnosis Date  . Anxiety   . Depression   . Hypertension   . Shingles     Past Surgical History:  Procedure Laterality Date  . CYSTOSCOPY Bilateral 03/05/2019   Procedure: CYSTOSCOPY;  Surgeon: Lavonia Drafts, MD;  Location: [facility];  Service: Gynecology;  Laterality: Bilateral;  . ROBOTIC ASSISTED LAPAROSCOPIC HYSTERECTOMY AND SALPINGECTOMY Bilateral 03/05/2019   Procedure: XI ROBOTIC ASSISTED LAPAROSCOPIC HYSTERECTOMY AND SALPINGECTOMY;  Surgeon: Lavonia Drafts, MD;  Location: [facility];  Service: Gynecology;  Laterality: Bilateral;  . TUBAL LIGATION  2002    Family History  Problem Relation Age of Onset  . Hypertension Mother   . Hypertension Father   . Stroke Father   . Coronary artery disease Other        female relative <50  . Breast cancer Neg Hx     Social History   Tobacco Use  . Smoking status: Never Smoker  . Smokeless tobacco: Never Used  Substance Use Topics  . Alcohol use: Yes    Comment: social  . Drug use: Not Currently    Prior to Admission medications   Medication Sig Start Date End Date Taking? Authorizing  Provider  ibuprofen (ADVIL) 800 MG tablet Take 1 tablet (800 mg total) by mouth every 8 (eight) hours as needed. 03/05/19  Yes Lavonia Drafts, MD  cetirizine (ZYRTEC) 10 MG tablet Take 1 tablet (10 mg total) by mouth daily. Patient taking differently: Take 10 mg by mouth daily as needed for allergies.  07/17/18   Midge Minium, MD  docusate sodium (COLACE) 100 MG capsule Take 1 capsule (100 mg total) by mouth 2 (two) times daily as needed. 03/05/19   Lavonia Drafts, MD  famotidine (PEPCID) 10 MG tablet Take 10 mg by mouth 2 (two) times daily.    [provider]  hydrochlorothiazide (HYDRODIURIL) 25 MG tablet Take 1 tablet (25 mg total) by mouth daily. 02/20/19   Lavonia Drafts, MD  LORazepam (ATIVAN) 0.5 MG tablet TAKE 1/2 TO 1 TABLET BY MOUTH AS NEEDED FOR SEVERE ANXIETY Patient taking differently: Take 0.25-0.5 mg by mouth daily as needed for anxiety.  10/16/17   Midge Minium, MD  oxyCODONE-acetaminophen (PERCOCET/ROXICET) 5-325 MG tablet Take 1-2 tablets by mouth every 6 (six) hours as needed. 03/05/19   Lavonia Drafts, MD    Allergies Penicillins and Sulfonamide derivatives   REVIEW OF SYSTEMS  Negative except as noted here or in the History of Present Illness.   PHYSICAL EXAMINATION  Initial Vital Signs Blood pressure (!) 158/104, pulse 87, temperature 98.7 F (37.1 C), temperature source Oral,  resp. rate 18, height 5\' 4"  (1.626 m), weight 82 kg, last menstrual period 02/15/2019, SpO2 100 %.  Examination General: Well-developed, well-nourished female in no acute distress; appearance consistent with age of record HENT: normocephalic; atraumatic Eyes: pupils equal, round and reactive to light; extraocular muscles intact Neck: supple Heart: regular rate and rhythm Lungs: clear to auscultation bilaterally Abdomen: soft; mildly distended; diffusely tender; well-healing laparotomy incisions with surrounding ecchymoses; bowel sounds  present Extremities: No deformity; full range of motion; pulses normal; left calf diameter 15 inches, right calf diameter 15.5 inches; tenderness of right medial thigh and right calf without palpable cord Neurologic: Awake, alert and oriented; motor function intact in all extremities and symmetric; no facial droop Skin: Warm and dry Psychiatric: Normal mood and affect   RESULTS  Summary of this visit's results, reviewed and interpreted by myself:   EKG Interpretation  Date/Time:    Ventricular Rate:    PR Interval:    QRS Duration:   QT Interval:    QTC Calculation:   R Axis:     Text Interpretation:        Laboratory Studies: No results found for this or any previous visit (from the past 24 hour(s)). Imaging Studies: No results found.  ED COURSE and MDM  Nursing notes, initial and subsequent vitals signs, including pulse oximetry, reviewed and interpreted by myself.  Vitals:   03/06/19 2300 03/06/19 2301  BP: (!) 158/104   Pulse: 87   Resp: 18   Temp: 98.7 F (37.1 C)   TempSrc: Oral   SpO2: 100%   Weight:  82 kg  Height:  5\' 4"  (1.626 m)   Medications  enoxaparin (LOVENOX) injection 80 mg (has no administration in time range)    11:43 PM Discussed with Dr. Kennon Rounds who states it is safe to give a dose of Lovenox pending vascular ultrasound tomorrow morning.  Risks and benefits of Lovenox were discussed with the patient and she agreed to receive a dose and will follow up with ultrasound at 8:00 tomorrow morning.   PROCEDURES  Procedures   ED DIAGNOSES     ICD-10-CM   1. Suspected DVT (deep vein thrombosis)  R09.89        Shanon Rosser, MD 03/06/19 2345    Shanon Rosser, MD 03/07/19 [id number]

## 2019-03-07 ENCOUNTER — Telehealth: Payer: Self-pay | Admitting: Obstetrics & Gynecology

## 2019-03-07 ENCOUNTER — Emergency Department (HOSPITAL_BASED_OUTPATIENT_CLINIC_OR_DEPARTMENT_OTHER)
Admission: EM | Admit: 2019-03-07 | Discharge: 2019-03-07 | Disposition: A | Discharge status: Home / Self Care | Payer: 59 | Source: Home / Self Care | Attending: Emergency Medicine | Admitting: Emergency Medicine

## 2019-03-07 ENCOUNTER — Other Ambulatory Visit: Payer: Self-pay

## 2019-03-07 ENCOUNTER — Ambulatory Visit (HOSPITAL_BASED_OUTPATIENT_CLINIC_OR_DEPARTMENT_OTHER)
Admit: 2019-03-07 | Discharge: 2019-03-07 | Disposition: A | Discharge status: Home / Self Care | Payer: 59 | Attending: Emergency Medicine | Admitting: Emergency Medicine

## 2019-03-07 ENCOUNTER — Encounter (HOSPITAL_BASED_OUTPATIENT_CLINIC_OR_DEPARTMENT_OTHER): Payer: Self-pay | Admitting: Emergency Medicine

## 2019-03-07 DIAGNOSIS — E876 Hypokalemia: Secondary | ICD-10-CM

## 2019-03-07 DIAGNOSIS — Z880 Allergy status to penicillin: Secondary | ICD-10-CM | POA: Insufficient documentation

## 2019-03-07 DIAGNOSIS — Z79899 Other long term (current) drug therapy: Secondary | ICD-10-CM | POA: Insufficient documentation

## 2019-03-07 DIAGNOSIS — R42 Dizziness and giddiness: Secondary | ICD-10-CM

## 2019-03-07 DIAGNOSIS — I10 Essential (primary) hypertension: Secondary | ICD-10-CM | POA: Insufficient documentation

## 2019-03-07 DIAGNOSIS — Z882 Allergy status to sulfonamides status: Secondary | ICD-10-CM | POA: Insufficient documentation

## 2019-03-07 LAB — CBC WITH DIFFERENTIAL/PLATELET
Abs Immature Granulocytes: 0.04 10*3/uL (ref 0.00–0.07)
Basophils Absolute: 0 10*3/uL (ref 0.0–0.1)
Basophils Relative: 0 %
Eosinophils Absolute: 0.1 10*3/uL (ref 0.0–0.5)
Eosinophils Relative: 1 %
HCT: 37 % (ref 36.0–46.0)
Hemoglobin: 11.7 g/dL — ABNORMAL LOW (ref 12.0–15.0)
Immature Granulocytes: 0 %
Lymphocytes Relative: 27 %
Lymphs Abs: 3 10*3/uL (ref 0.7–4.0)
MCH: 27.3 pg (ref 26.0–34.0)
MCHC: 31.6 g/dL (ref 30.0–36.0)
MCV: 86.2 fL (ref 80.0–100.0)
Monocytes Absolute: 0.4 10*3/uL (ref 0.1–1.0)
Monocytes Relative: 4 %
Neutro Abs: 7.6 10*3/uL (ref 1.7–7.7)
Neutrophils Relative %: 68 %
Platelets: 251 10*3/uL (ref 150–400)
RBC: 4.29 MIL/uL (ref 3.87–5.11)
RDW: 14.1 % (ref 11.5–15.5)
WBC: 11.2 10*3/uL — ABNORMAL HIGH (ref 4.0–10.5)
nRBC: 0 % (ref 0.0–0.2)

## 2019-03-07 LAB — URINALYSIS, ROUTINE W REFLEX MICROSCOPIC
Bilirubin Urine: NEGATIVE
Glucose, UA: NEGATIVE mg/dL
Ketones, ur: NEGATIVE mg/dL
Nitrite: NEGATIVE
Protein, ur: NEGATIVE mg/dL
Specific Gravity, Urine: 1.02 (ref 1.005–1.030)
pH: 6.5 (ref 5.0–8.0)

## 2019-03-07 LAB — URINALYSIS, MICROSCOPIC (REFLEX)

## 2019-03-07 LAB — BASIC METABOLIC PANEL
Anion gap: 9 (ref 5–15)
BUN: 18 mg/dL (ref 6–20)
CO2: 23 mmol/L (ref 22–32)
Calcium: 8.7 mg/dL — ABNORMAL LOW (ref 8.9–10.3)
Chloride: 107 mmol/L (ref 98–111)
Creatinine, Ser: 0.61 mg/dL (ref 0.44–1.00)
GFR calc Af Amer: >60 mL/min (ref >60)
GFR calc non Af Amer: >60 mL/min (ref >60)
Glucose, Bld: 139 mg/dL — ABNORMAL HIGH (ref 70–99)
Potassium: 3.1 mmol/L — ABNORMAL LOW (ref 3.5–5.1)
Sodium: 139 mmol/L (ref 135–145)

## 2019-03-07 LAB — SURGICAL PATHOLOGY

## 2019-03-07 MED ORDER — SODIUM CHLORIDE 0.9 % IV BOLUS
1000.0000 mL | Freq: Once | INTRAVENOUS | Status: AC
  Administered 2019-03-07: 1000 mL via INTRAVENOUS

## 2019-03-07 MED ORDER — ONDANSETRON HCL 4 MG/2ML IJ SOLN: 4.0000 mg | Freq: Once | INTRAMUSCULAR | Status: DC

## 2019-03-07 MED ORDER — POTASSIUM CHLORIDE CRYS ER 20 MEQ PO TBCR
40.0000 meq | EXTENDED_RELEASE_TABLET | Freq: Once | ORAL | Status: AC
  Administered 2019-03-07: 40 meq via ORAL
  Filled 2019-03-07: qty 2

## 2019-03-07 NOTE — Discharge Summary (Signed)
Physician Discharge Summary  Patient ID: Angela Morris MRN: [id number] DOB/AGE: 02/03/1970 49 y.o.  Admit date: 03/05/2019 Discharge date: 03/07/2019  Admission Diagnoses: AUB' pelvic pain; fibroids.   Discharge Diagnoses:  Principal Problem:   Fibroid uterus Active Problems:   Anxiety   Essential hypertension   Excessive or frequent menstruation   Post-operative state   Discharged Condition: good  Hospital Course: Patient had an uncomplicated surgery; for further details of this surgery, please refer to the operative note. Furthermore, the patient had an uncomplicated postoperative course.  By time of discharge, her pain was controlled on oral pain medications; she was ambulating, voiding without difficulty, tolerating regular diet and passing flatus.  She was deemed stable for discharge to home.    Consults: None  Treatments: surgery: Robot assisted total laparoscopic hysterectomy with bilateral salpingectomy    Discharge Exam: Blood pressure (!) 142/83, pulse 90, temperature 98.9 F (37.2 C), resp. rate 18, height 5\' 4"  (1.626 m), weight 82.2 kg, last menstrual period 02/15/2019, SpO2 100 %. General appearance: alert, cooperative and icteric GI: soft, non-tender; bowel sounds normal; no masses,  no organomegaly Extremities: extremities normal, atraumatic, no cyanosis or edema  Disposition: Discharge disposition: 01-Home or Self Care       Discharge Instructions    Call MD for:  difficulty breathing, headache or visual disturbances   Complete by: As directed    Call MD for:  extreme fatigue   Complete by: As directed    Call MD for:  hives   Complete by: As directed    Call MD for:  persistant dizziness or light-headedness   Complete by: As directed    Call MD for:  persistant nausea and vomiting   Complete by: As directed    Call MD for:  redness, tenderness, or signs of infection (pain, swelling, redness, odor or green/yellow discharge around incision  site)   Complete by: As directed    Call MD for:  severe uncontrolled pain   Complete by: As directed    Call MD for:  temperature >100.4   Complete by: As directed    Diet - low sodium heart healthy   Complete by: As directed    Driving Restrictions   Complete by: As directed    No driving for 2 weeks   Increase activity slowly   Complete by: As directed    Lifting restrictions   Complete by: As directed    No heavy lifting for 6 weeks   Sexual Activity Restrictions   Complete by: As directed    No intercourse for 8 weeks     Allergies as of 03/05/2019      Reactions   Penicillins Hives   Did it involve swelling of the face/tongue/throat, SOB, or low BP? No Did it involve sudden or severe rash/hives, skin peeling, or any reaction on the inside of your mouth or nose? No Did you need to seek medical attention at a hospital or doctor's office? No When did it last happen?20's If all above answers are "NO", may proceed with cephalosporin use.   Sulfonamide Derivatives Shortness Of Breath      Medication List    TAKE these medications   cetirizine 10 MG tablet Commonly known as: ZYRTEC Take 1 tablet (10 mg total) by mouth daily. What changed:   when to take this  reasons to take this   docusate sodium 100 MG capsule Commonly known as: COLACE Take 1 capsule (100 mg total) by mouth 2 (two)  times daily as needed.   famotidine 10 MG tablet Commonly known as: PEPCID Take 10 mg by mouth 2 (two) times daily.   hydrochlorothiazide 25 MG tablet Commonly known as: HYDRODIURIL Take 1 tablet (25 mg total) by mouth daily.   ibuprofen 800 MG tablet Commonly known as: ADVIL Take 1 tablet (800 mg total) by mouth every 8 (eight) hours as needed.   LORazepam 0.5 MG tablet Commonly known as: ATIVAN TAKE 1/2 TO 1 TABLET BY MOUTH AS NEEDED FOR SEVERE ANXIETY What changed:   how much to take  how to take this  when to take this  reasons to take this  additional  instructions   oxyCODONE-acetaminophen 5-325 MG tablet Commonly known as: PERCOCET/ROXICET Take 1-2 tablets by mouth every 6 (six) hours as needed.      Follow-up Information    [facility] In 2 weeks.   Contact information: [address] [place] [phone]          Signed: Lavonia Drafts 03/07/2019, 9:52 AM

## 2019-03-07 NOTE — Telephone Encounter (Signed)
TC to pt. She reports that she is feeling better. She went for duplex dopplers which were neg (per pt). She reports gas pains. The pain in her leg has 'subsided'. Pt is eating with some nausea and no vomiting. Pt will walk more today. She reports feeling much better now. She is en route to home from the doppler study.   I have asked her to call tomorrow with an update on her progress or sooner prn. All questions were answered.   Jenette Rayson L. Harraway-Smith, M.D., Cherlynn June

## 2019-03-07 NOTE — ED Provider Notes (Signed)
[facility] DE[facility] Provider Note: Georgena Spurling, MD, FACEP  CSN: [id number] MRN: [id number] ARRIVAL: 03/07/19 at [place]: [place]  Dizziness   HISTORY OF PRESENT ILLNESS  03/07/19 2:15 AM Angela Morris is a 49 y.o. female who had a hysterectomy laparoscopically 2 days ago.  She was seen by myself yesterday evening for pain in her right leg suspicious for DVT.  She was given a shot of Lovenox and scheduled for a Doppler ultrasound of her right lower extremity later this morning.  She returns complaining of dizziness.  She was already having dizziness at discharge earlier but this has worsened after taking the hydrocodone at home this morning for postoperative abdominal pain.  By dizziness she means lightheadedness, worse with standing.  She is also having some pressure, but not pain, on the right side of her head when she stands.  She describes this as being a part of her dizziness rather than an actual headache, although she rates it as a 7 out of 10.  The hydrocodone also made her nauseated but she has not vomited.  She ate some crackers to help her nausea.     Past Medical History:  Diagnosis Date  . Anxiety   . Depression   . Hypertension   . Shingles     Past Surgical History:  Procedure Laterality Date  . CYSTOSCOPY Bilateral 03/05/2019   Procedure: CYSTOSCOPY;  Surgeon: Lavonia Drafts, MD;  Location: [facility];  Service: Gynecology;  Laterality: Bilateral;  . ROBOTIC ASSISTED LAPAROSCOPIC HYSTERECTOMY AND SALPINGECTOMY Bilateral 03/05/2019   Procedure: XI ROBOTIC ASSISTED LAPAROSCOPIC HYSTERECTOMY AND SALPINGECTOMY;  Surgeon: Lavonia Drafts, MD;  Location: [facility];  Service: Gynecology;  Laterality: Bilateral;  . TUBAL LIGATION  2002    Family History  Problem Relation Age of Onset  . Hypertension Mother   . Hypertension Father   . Stroke Father   . Coronary artery disease Other      female relative <50  . Breast cancer Neg Hx     Social History   Tobacco Use  . Smoking status: Never Smoker  . Smokeless tobacco: Never Used  Substance Use Topics  . Alcohol use: Yes    Comment: social  . Drug use: Not Currently    Prior to Admission medications   Medication Sig Start Date End Date Taking? Authorizing Provider  cetirizine (ZYRTEC) 10 MG tablet Take 1 tablet (10 mg total) by mouth daily. Patient taking differently: Take 10 mg by mouth daily as needed for allergies.  07/17/18   Midge Minium, MD  docusate sodium (COLACE) 100 MG capsule Take 1 capsule (100 mg total) by mouth 2 (two) times daily as needed. 03/05/19   Lavonia Drafts, MD  famotidine (PEPCID) 10 MG tablet Take 10 mg by mouth 2 (two) times daily.    [provider]  hydrochlorothiazide (HYDRODIURIL) 25 MG tablet Take 1 tablet (25 mg total) by mouth daily. 02/20/19   Lavonia Drafts, MD  ibuprofen (ADVIL) 800 MG tablet Take 1 tablet (800 mg total) by mouth every 8 (eight) hours as needed. 03/05/19   Lavonia Drafts, MD  LORazepam (ATIVAN) 0.5 MG tablet TAKE 1/2 TO 1 TABLET BY MOUTH AS NEEDED FOR SEVERE ANXIETY Patient taking differently: Take 0.25-0.5 mg by mouth daily as needed for anxiety.  10/16/17   Midge Minium, MD  oxyCODONE-acetaminophen (PERCOCET/ROXICET) 5-325 MG tablet Take 1-2 tablets by mouth every 6 (six) hours as needed. 03/05/19  Lavonia Drafts, MD    Allergies Penicillins and Sulfonamide derivatives   REVIEW OF SYSTEMS  Negative except as noted here or in the History of Present Illness.   PHYSICAL EXAMINATION  Initial Vital Signs Blood pressure 133/81, pulse 86, temperature 98.6 F (37 C), temperature source Oral, resp. rate 18, height 5\' 4"  (1.626 m), weight 82 kg, last menstrual period 02/15/2019, SpO2 99 %.  Examination General: Well-developed, well-nourished female in no acute distress; appearance consistent with age of record  HENT: normocephalic; atraumatic Eyes: pupils equal, round and reactive to light; extraocular muscles intact Neck: supple Heart: regular rate and rhythm Lungs: clear to auscultation bilaterally Abdomen: soft; mildly distended; diffusely tender; bowel sounds present Extremities: No deformity; full range of motion Neurologic: Awake, alert and oriented; motor function intact in all extremities and symmetric; no facial droop; normal coordination, speech and gait Skin: Warm and dry Psychiatric: Flat affect   RESULTS  Summary of this visit's results, reviewed and interpreted by myself:   EKG Interpretation  Date/Time:    Ventricular Rate:    PR Interval:    QRS Duration:   QT Interval:    QTC Calculation:   R Axis:     Text Interpretation:        Laboratory Studies: Results for orders placed or performed during the hospital encounter of 03/07/19 (from the past 24 hour(s))  Urinalysis, Routine w reflex microscopic     Status: Abnormal   Collection Time: 03/07/19  2:25 AM  Result Value Ref Range   Color, Urine YELLOW YELLOW   APPearance HAZY (A) CLEAR   Specific Gravity, Urine 1.020 1.005 - 1.030   pH 6.5 5.0 - 8.0   Glucose, UA NEGATIVE NEGATIVE mg/dL   Hgb urine dipstick LARGE (A) NEGATIVE   Bilirubin Urine NEGATIVE NEGATIVE   Ketones, ur NEGATIVE NEGATIVE mg/dL   Protein, ur NEGATIVE NEGATIVE mg/dL   Nitrite NEGATIVE NEGATIVE   Leukocytes,Ua TRACE (A) NEGATIVE  CBC with Differential/Platelet     Status: Abnormal   Collection Time: 03/07/19  2:25 AM  Result Value Ref Range   WBC 11.2 (H) 4.0 - 10.5 K/uL   RBC 4.29 3.87 - 5.11 MIL/uL   Hemoglobin 11.7 (L) 12.0 - 15.0 g/dL   HCT 37.0 36.0 - 46.0 %   MCV 86.2 80.0 - 100.0 fL   MCH 27.3 26.0 - 34.0 pg   MCHC 31.6 30.0 - 36.0 g/dL   RDW 14.1 11.5 - 15.5 %   Platelets 251 150 - 400 K/uL   nRBC 0.0 0.0 - 0.2 %   Neutrophils Relative % 68 %   Neutro Abs 7.6 1.7 - 7.7 K/uL   Lymphocytes Relative 27 %   Lymphs Abs 3.0 0.7  - 4.0 K/uL   Monocytes Relative 4 %   Monocytes Absolute 0.4 0.1 - 1.0 K/uL   Eosinophils Relative 1 %   Eosinophils Absolute 0.1 0.0 - 0.5 K/uL   Basophils Relative 0 %   Basophils Absolute 0.0 0.0 - 0.1 K/uL   Immature Granulocytes 0 %   Abs Immature Granulocytes 0.04 0.00 - 0.07 K/uL  Basic metabolic panel     Status: Abnormal   Collection Time: 03/07/19  2:25 AM  Result Value Ref Range   Sodium 139 135 - 145 mmol/L   Potassium 3.1 (L) 3.5 - 5.1 mmol/L   Chloride 107 98 - 111 mmol/L   CO2 23 22 - 32 mmol/L   Glucose, Bld 139 (H) 70 - 99 mg/dL  BUN 18 6 - 20 mg/dL   Creatinine, Ser 0.61 0.44 - 1.00 mg/dL   Calcium 8.7 (L) 8.9 - 10.3 mg/dL   GFR calc non Af Amer >60 >60 mL/min   GFR calc Af Amer >60 >60 mL/min   Anion gap 9 5 - 15  Urinalysis, Microscopic (reflex)     Status: Abnormal   Collection Time: 03/07/19  2:25 AM  Result Value Ref Range   RBC / HPF 21-50 0 - 5 RBC/hpf   WBC, UA 0-5 0 - 5 WBC/hpf   Bacteria, UA MANY (A) NONE SEEN   Squamous Epithelial / LPF 11-20 0 - 5   Imaging Studies: No results found.  ED COURSE and MDM  Nursing notes, initial and subsequent vitals signs, including pulse oximetry, reviewed and interpreted by myself.  Vitals:   03/07/19 0208 03/07/19 0210 03/07/19 0349  BP:  133/81 124/80  Pulse:  86 87  Resp:  18 18  Temp:  98.6 F (37 C)   TempSrc:  Oral   SpO2:  99% 100%  Weight: 82 kg    Height: 5\' 4"  (1.626 m)     Medications  ondansetron (ZOFRAN) injection 4 mg (4 mg Intravenous Refused 03/07/19 0240)  sodium chloride 0.9 % bolus 1,000 mL ( Intravenous Stopped 03/07/19 0331)  potassium chloride SA (KLOR-CON) CR tablet 40 mEq (40 mEq Oral Given 03/07/19 0315)   3:54 AM Patient feeling better after IV fluid bolus.  Patient also given potassium repletion orally.  Patient advised that fluid shifts and reaction to anesthesia after surgery often cause the symptoms she is having.  This is unlikely a reaction to Lovenox as she was already  feeling dizzy before being given Lovenox.  She will return at 8 AM for her Doppler study.   PROCEDURES  Procedures   ED DIAGNOSES     ICD-10-CM   1. Lightheadedness  R42   2. Hypokalemia  E87.6        Traeh Milroy, Jenny Reichmann, MD 03/07/19 [phone]

## 2019-03-07 NOTE — ED Triage Notes (Signed)
Patient presents with complaints of dizziness and pressure on the right side of her head; states she took one hyrdocodone and states felt dizzy and nausea at home.

## 2019-03-20 ENCOUNTER — Encounter: Payer: Self-pay | Admitting: Obstetrics & Gynecology

## 2019-03-20 ENCOUNTER — Other Ambulatory Visit: Payer: Self-pay

## 2019-03-20 ENCOUNTER — Ambulatory Visit (INDEPENDENT_AMBULATORY_CARE_PROVIDER_SITE_OTHER): Payer: 59 | Admitting: Obstetrics & Gynecology

## 2019-03-20 VITALS — BP 121/87 | HR 82 | Ht 64.0 in | Wt 181.1 lb

## 2019-03-20 DIAGNOSIS — Z9889 Other specified postprocedural states: Secondary | ICD-10-CM

## 2019-03-20 NOTE — Progress Notes (Signed)
History:  49 y.o. G2P2000 here today for 2 week post op check following [place] with bilateral salpingectomy. Pt reports initial constipation that has improved. She reports some occ spotting. She reports some mild low back pain but, is otherwise without complaints. She did fall down the steps while doing laundry.     The following portions of the patient's history were reviewed and updated as appropriate: allergies, current medications, past family history, past medical history, past social history, past surgical history and problem list.  Review of Systems:  Pertinent items are noted in HPI.    Objective:  Physical Exam Blood pressure 121/87, pulse 82, height 5\' 4"  (1.626 m), weight 181 lb 1.3 oz (82.1 kg), last menstrual period 02/15/2019.  CONSTITUTIONAL: Well-developed, well-nourished female in no acute distress.  HENT:  Normocephalic, atraumatic EYES: Conjunctivae and EOM are normal. No scleral icterus.  NECK: Normal range of motion SKIN: Skin is warm and dry. No rash noted. Not diaphoretic.No pallor. [place]: Alert and oriented to person, place, and time. Normal coordination.  Abd: Soft, nontender and nondistended; port sites healing well. Pelvic: not done  Labs and Imaging US Venous Img Lower Unilateral Right  Result Date: 03/07/2019 CLINICAL DATA:  Right lower extremity pain after surgery. EXAM: Right LOWER EXTREMITY VENOUS DOPPLER ULTRASOUND TECHNIQUE: Gray-scale sonography with compression, as well as color and duplex ultrasound, were performed to evaluate the deep venous system(s) from the level of the common femoral vein through the popliteal and proximal calf veins. COMPARISON:  None. FINDINGS: VENOUS Normal compressibility of the common femoral, superficial femoral, and popliteal veins, as well as the visualized calf veins. Visualized portions of profunda femoral vein and great saphenous vein unremarkable. No filling defects to suggest DVT on grayscale or color Doppler imaging.  Doppler waveforms show normal direction of venous flow, normal respiratory phasicity and response to augmentation. Limited views of the contralateral common femoral vein are unremarkable. OTHER None. Limitations: none IMPRESSION: No femoropopliteal DVT nor evidence of DVT within the visualized calf veins. If clinical symptoms are inconsistent or if there are persistent or worsening symptoms, further imaging (possibly involving the iliac veins) may be warranted. Electronically Signed   By: Marijo Conception M.D.   On: 03/07/2019 08:40   surg path  03/05/2019 UTERUS, CERVIX AND BILATERAL FALLOPIAN TUBES; HYSTERECTOMY AND  BILATERAL SALPINGECTOMY:  Cervix:  - Mild acute and chronic cervicitis.   Uterus:  - Endometrium: Secretory endometrium.  - Myometrium: Leiomyomata.  - Serosa: No significant histopathologic findings.   Adnexa:  - Fallopian tubes: No significant histopathologic findings.   Assessment & Plan:  2 week post op check following [place] with bilateral salpingectomy. Pt has swelling of her leg and pain on POD #1 so she was seen in the ED. No DVT noted. Pts sx are improved.   Reviewed gradual increase in activities  Pt may go back to work 1/2 days for 1-2 weeks then full days after 4 weeks  F/u in 4 weeks or sooner prn    Reviewed surg path  Rithika Seel L. Harraway-Smith, M.D., Cherlynn June

## 2019-03-20 NOTE — Patient Instructions (Signed)

## 2019-03-25 ENCOUNTER — Telehealth: Payer: Self-pay

## 2019-03-25 NOTE — Telephone Encounter (Signed)
AC from [place] called to verify pt's surgery date and disability information. Laurance Heide l Ahriyah Vannest, CMA

## 2019-04-03 ENCOUNTER — Encounter: Payer: 59 | Admitting: Family Medicine

## 2019-04-09 ENCOUNTER — Telehealth: Payer: Self-pay | Admitting: Obstetrics & Gynecology

## 2019-04-09 NOTE — Telephone Encounter (Signed)
TC to pt. She reports bright red blood and says that she 'saturated a pad' yesterday. She denies pain or other assoc sx. She has an appt to be seen on tomorrow and feels that she can wait for that visit. Today she noted some blood with wiping only.   Angela Morris, M.D., Cherlynn June

## 2019-04-10 ENCOUNTER — Ambulatory Visit (INDEPENDENT_AMBULATORY_CARE_PROVIDER_SITE_OTHER): Payer: 59 | Admitting: Obstetrics & Gynecology

## 2019-04-10 ENCOUNTER — Encounter: Payer: Self-pay | Admitting: Obstetrics & Gynecology

## 2019-04-10 ENCOUNTER — Other Ambulatory Visit: Payer: Self-pay

## 2019-04-10 VITALS — BP 123/61 | HR 81 | Ht 64.0 in | Wt 173.0 lb

## 2019-04-10 DIAGNOSIS — N939 Abnormal uterine and vaginal bleeding, unspecified: Secondary | ICD-10-CM

## 2019-04-10 NOTE — Progress Notes (Signed)
History:  49 y.o. G2P2000 here today for eval of vaginal bleeding post op. Pt denies pain or other discharge or odor. The only change was that she began to walk for exercise. She denies being sexually active or having anything in the vagina. Pt is s/p RATH on 03/04/2018.   The following portions of the patient's history were reviewed and updated as appropriate: allergies, current medications, past family history, past medical history, past social history, past surgical history and problem list.  Review of Systems:  Pertinent items are noted in HPI.    Objective:  Physical Exam Blood pressure 123/61, pulse 81, height 5\' 4"  (1.626 m), weight 173 lb (78.5 kg), last menstrual period 02/15/2019.  CONSTITUTIONAL: Well-developed, well-nourished female in no acute distress.  HENT:  Normocephalic, atraumatic EYES: Conjunctivae and EOM are normal. No scleral icterus.  NECK: Normal range of motion SKIN: Skin is warm and dry. No rash noted. Not diaphoretic.No pallor. [place]: Alert and oriented to person, place, and time. Normal coordination.  Abd: Soft, nontender and nondistended Pelvic: Normal appearing external genitalia; normal appearing vaginal mucosa but, at the cuff there is slight oozing of blood along the entire cuff. There is old blood in the vault. Normal discharge.  There is no palpable masses or adnexal tenderness.  Silver nitrate was applied to the cuff and the oozing stopped completely.   Assessment & Plan:  Post op bleeding of the vaginal cuff 4 weeks after RATH with bilateral salpingectomy .  F/u in 2 weeks or sooner prn  Reviewed post op activity.   Peniel Hass L. Harraway-Smith, M.D., Cherlynn June

## 2019-04-12 ENCOUNTER — Ambulatory Visit (HOSPITAL_BASED_OUTPATIENT_CLINIC_OR_DEPARTMENT_OTHER)
Admission: RE | Admit: 2019-04-12 | Discharge: 2019-04-12 | Disposition: A | Discharge status: Home / Self Care | Payer: 59 | Source: Ambulatory Visit | Attending: Obstetrics & Gynecology | Admitting: Obstetrics & Gynecology

## 2019-04-12 ENCOUNTER — Other Ambulatory Visit: Payer: Self-pay | Admitting: Obstetrics & Gynecology

## 2019-04-12 ENCOUNTER — Other Ambulatory Visit: Payer: Self-pay

## 2019-04-12 ENCOUNTER — Telehealth: Payer: Self-pay

## 2019-04-12 DIAGNOSIS — T8131XA Disruption of external operation (surgical) wound, not elsewhere classified, initial encounter: Secondary | ICD-10-CM

## 2019-04-12 DIAGNOSIS — N898 Other specified noninflammatory disorders of vagina: Secondary | ICD-10-CM

## 2019-04-12 DIAGNOSIS — N939 Abnormal uterine and vaginal bleeding, unspecified: Secondary | ICD-10-CM

## 2019-04-12 MED ORDER — METRONIDAZOLE 500 MG PO TABS: 500.0000 mg | ORAL_TABLET | Freq: Two times a day (BID) | ORAL | 0 refills | Status: DC

## 2019-04-12 NOTE — Telephone Encounter (Signed)
Pt called the office stating she is having bright red bleeding this morning. Pt states she is having to wear a light pad. Per Dr. Ihor Dow, needs a [place] and antibiotics to rule out inflammation.Pt is scheduled for [place] and f/u appt. Understanding was voiced.  Angela Morris l Faustine Tates, CMA

## 2019-04-15 ENCOUNTER — Ambulatory Visit (INDEPENDENT_AMBULATORY_CARE_PROVIDER_SITE_OTHER): Payer: 59 | Admitting: Obstetrics & Gynecology

## 2019-04-15 ENCOUNTER — Other Ambulatory Visit: Payer: Self-pay

## 2019-04-15 ENCOUNTER — Encounter: Payer: Self-pay | Admitting: Obstetrics & Gynecology

## 2019-04-15 VITALS — BP 125/73 | HR 74 | Ht 64.0 in | Wt 178.0 lb

## 2019-04-15 DIAGNOSIS — Z90710 Acquired absence of both cervix and uterus: Secondary | ICD-10-CM

## 2019-04-15 DIAGNOSIS — Z09 Encounter for follow-up examination after completed treatment for conditions other than malignant neoplasm: Secondary | ICD-10-CM

## 2019-04-15 DIAGNOSIS — N939 Abnormal uterine and vaginal bleeding, unspecified: Secondary | ICD-10-CM

## 2019-04-15 NOTE — Progress Notes (Signed)
History:  49 y.o. G2P2000 here today for f/u of vaginal bleeding. She had an [place] that showed no dehiscence. She was started of Flagy 3 days ago. Her sx improved after starting the atbx.  Once the bleeding stopped she noted that the discharge is clear and runny. She denies fever, chills or pain.      The following portions of the patient's history were reviewed and updated as appropriate: allergies, current medications, past family history, past medical history, past social history, past surgical history and problem list.  Review of Systems:  Pertinent items are noted in HPI.    Objective:  Physical Exam Last menstrual period 02/15/2019. BP 125/73   Pulse 74   Ht 5\' 4"  (1.626 m)   Wt 178 lb (80.7 kg)   LMP 02/15/2019   BMI 30.55 kg/m   CONSTITUTIONAL: Well-developed, well-nourished female in no acute distress.  HENT:  Normocephalic, atraumatic EYES: Conjunctivae and EOM are normal. No scleral icterus.  NECK: Normal range of motion SKIN: Skin is warm and dry. No rash noted. Not diaphoretic.No pallor. [place]: Alert and oriented to person, place, and time. Normal coordination.  Pelvic: Normal appearing external genitalia; the vaginal cuff is still friable but, there is no active bleeding. The discharge is gray and thin. No odor appreciated.    Labs and Imaging US PELVIS TRANSVAGINAL NON-OB (TV ONLY)  Result Date: 04/12/2019 CLINICAL DATA:  Initial evaluation for dehiscence of the vaginal cuff, status post hysterectomy the approximately 5 weeks ago. EXAM: ULTRASOUND PELVIS TRANSVAGINAL TECHNIQUE: Transvaginal ultrasound examination of the pelvis was performed including evaluation of the uterus, ovaries, adnexal regions, and pelvic cul-de-sac. COMPARISON:  Prior ultrasound from 11/20/2018. FINDINGS: Uterus Prior hysterectomy. Views of the vaginal cuff demonstrate no obvious dehiscence or other abnormality. Endometrium Surgically absent. Right ovary Measurements: 2.2 x 2.5 x 1.6 cm = volume:  4.6 mL. Normal appearance/no adnexal mass. Left ovary Measurements: 3.4 x 1.7 x 1.6 cm = volume: 11.3 mL. Normal appearance/no adnexal mass. Other findings:  No abnormal free fluid IMPRESSION: Status post hysterectomy. No discernible dehiscence or other abnormality about the vaginal cuff. Electronically Signed   By: Jeannine Boga M.D.   On: 04/12/2019 22:04    Assessment & Plan:  Vaginal bleeding post RATH. Thought due to BV  Keep flagyl 500mg  bid to complete 7 days  F/u in 1 week or sooner prn     Reviewed post op instructions.   No ETOH while on Flagyl.   Fabian Walder L. Harraway-Smith, M.D., Cherlynn June

## 2019-04-22 ENCOUNTER — Encounter: Payer: Self-pay | Admitting: Obstetrics & Gynecology

## 2019-04-22 ENCOUNTER — Ambulatory Visit (INDEPENDENT_AMBULATORY_CARE_PROVIDER_SITE_OTHER): Payer: 59 | Admitting: Obstetrics & Gynecology

## 2019-04-22 ENCOUNTER — Other Ambulatory Visit: Payer: Self-pay

## 2019-04-22 VITALS — BP 129/94 | HR 78 | Ht 64.0 in | Wt 183.0 lb

## 2019-04-22 DIAGNOSIS — Z9889 Other specified postprocedural states: Secondary | ICD-10-CM

## 2019-04-22 NOTE — Progress Notes (Signed)
History:  49 y.o. G2P2000 here today for 6 week post op check and f/u of BV. Pt reports that she has no bleeding any longer. She denies pain of any type. She is voiding and passing stools without difficulty.   The following portions of the patient's history were reviewed and updated as appropriate: allergies, current medications, past family history, past medical history, past social history, past surgical history and problem list.  Review of Systems:  Pertinent items are noted in HPI.    Objective:  Physical Exam Blood pressure (!) 129/94, pulse 78, height 5\' 4"  (1.626 m), weight 183 lb (83 kg), last menstrual period 02/15/2019.  CONSTITUTIONAL: Well-developed, well-nourished female in no acute distress.  HENT:  Normocephalic, atraumatic EYES: Conjunctivae and EOM are normal. No scleral icterus.  NECK: Normal range of motion SKIN: Skin is warm and dry. No rash noted. Not diaphoretic.No pallor. [place]: Alert and oriented to person, place, and time. Normal coordination.  Abd: Soft, nontender and nondistended; port sites well healed.  Pelvic: Normal appearing external genitalia; vaginal mucosa healing well. Cuff has sutures noted. It is intact and not friable. NO bleeding noted. No palpable masses or adnexal tenderness  Labs and Imaging US PELVIS TRANSVAGINAL NON-OB (TV ONLY)  Result Date: 04/12/2019 CLINICAL DATA:  Initial evaluation for dehiscence of the vaginal cuff, status post hysterectomy the approximately 5 weeks ago. EXAM: ULTRASOUND PELVIS TRANSVAGINAL TECHNIQUE: Transvaginal ultrasound examination of the pelvis was performed including evaluation of the uterus, ovaries, adnexal regions, and pelvic cul-de-sac. COMPARISON:  Prior ultrasound from 11/20/2018. FINDINGS: Uterus Prior hysterectomy. Views of the vaginal cuff demonstrate no obvious dehiscence or other abnormality. Endometrium Surgically absent. Right ovary Measurements: 2.2 x 2.5 x 1.6 cm = volume: 4.6 mL. Normal  appearance/no adnexal mass. Left ovary Measurements: 3.4 x 1.7 x 1.6 cm = volume: 11.3 mL. Normal appearance/no adnexal mass. Other findings:  No abnormal free fluid IMPRESSION: Status post hysterectomy. No discernible dehiscence or other abnormality about the vaginal cuff. Electronically Signed   By: Jeannine Boga M.D.   On: 04/12/2019 22:04    Assessment & Plan:  6 week post op check doing well. Post op course complicated with BV- improved.   F/u in 2 weeks or sooner prn  Dwan Hemmelgarn L. Harraway-Smith, M.D., Cherlynn June

## 2019-05-09 ENCOUNTER — Encounter: Payer: Self-pay | Admitting: Obstetrics & Gynecology

## 2019-05-09 ENCOUNTER — Ambulatory Visit (INDEPENDENT_AMBULATORY_CARE_PROVIDER_SITE_OTHER): Payer: 59 | Admitting: Obstetrics & Gynecology

## 2019-05-09 ENCOUNTER — Other Ambulatory Visit: Payer: Self-pay

## 2019-05-09 VITALS — BP 119/80 | HR 78 | Ht 64.0 in | Wt 177.0 lb

## 2019-05-09 DIAGNOSIS — Z90710 Acquired absence of both cervix and uterus: Secondary | ICD-10-CM

## 2019-05-09 DIAGNOSIS — Z9889 Other specified postprocedural states: Secondary | ICD-10-CM

## 2019-05-09 NOTE — Progress Notes (Signed)
History:  49 y.o. G2P2000 here today for 8 week post op check following [place] with bilateral salpingectomy. Pts post op course was complicated somewhat with post op vaginal bleeding. Thought due to BV. Pt denies any further vaginal bleeding at present.      The following portions of the patient's history were reviewed and updated as appropriate: allergies, current medications, past family history, past medical history, past social history, past surgical history and problem list.  Review of Systems:  Pertinent items are noted in HPI.    Objective:  Physical Exam Blood pressure 119/80, pulse 78, height 5\' 4"  (1.626 m), weight 177 lb (80.3 kg), last menstrual period 02/15/2019.  CONSTITUTIONAL: Well-developed, well-nourished female in no acute distress.  HENT:  Normocephalic, atraumatic EYES: Conjunctivae and EOM are normal. No scleral icterus.  NECK: Normal range of motion SKIN: Skin is warm and dry. No rash noted. Not diaphoretic.No pallor. [place] Neck: Alert and oriented to person, place, and time. Normal coordination.  Abd: Soft, nontender and nondistended Pelvic: Normal appearing external genitalia; vag cuff well healed. No lesions noted. Intact.   Assessment & Plan:  8 week Post op check following [place] with bilateral salpingectomy  May return to full activities at present All questions answered.   Dorothy Polhemus L. Harraway-Smith, M.D., Cherlynn June

## 2019-05-17 ENCOUNTER — Telehealth: Payer: Self-pay | Admitting: Family Medicine

## 2019-05-17 NOTE — Telephone Encounter (Signed)
MEDICATION: Lorazepam (ativan) 0.5 MG tablet   PHARMACY: [facility] [address] at [place]   Comments: Pt needs this refilled ASAP due to going out of town tomorrow   **Let patient know to contact pharmacy at the end of the day to make sure medication is ready. **  ** Please notify patient to allow 48-72 hours to process**  **Encourage patient to contact the pharmacy for refills or they can request refills through [facility]**

## 2019-05-17 NOTE — Telephone Encounter (Signed)
Last OV 07/17/18 Lorazepam last filled 10/16/17 #30 with 0

## 2019-05-20 ENCOUNTER — Other Ambulatory Visit: Payer: Self-pay | Admitting: Family Medicine

## 2019-05-20 MED ORDER — LORAZEPAM 0.5 MG PO TABS: ORAL_TABLET | ORAL | 0 refills | Status: DC

## 2019-05-20 NOTE — Telephone Encounter (Signed)
Pt called in stating that [facility] in [facility] [place]. Pt states that she out of town in [place], she wanted to know if the medication could be cancelled at the local pharmacy and sent to: [facility] Store # [phone]  [place] [address] Ph# [phone]   Please advise

## 2019-05-20 NOTE — Telephone Encounter (Signed)
Please advise 

## 2020-03-22 IMAGING — US US PELVIS COMPLETE WITH TRANSVAGINAL
1 series · 13 of 25 positions shown · non-contrast
Comparison: Prior ultrasound from 08/31/2009.

CLINICAL DATA: Follow-up examination for uterine fibroids.



[Series 1: us pelvis complete with transvaginal · 13 of 44 slices shown]
[im 1/44]
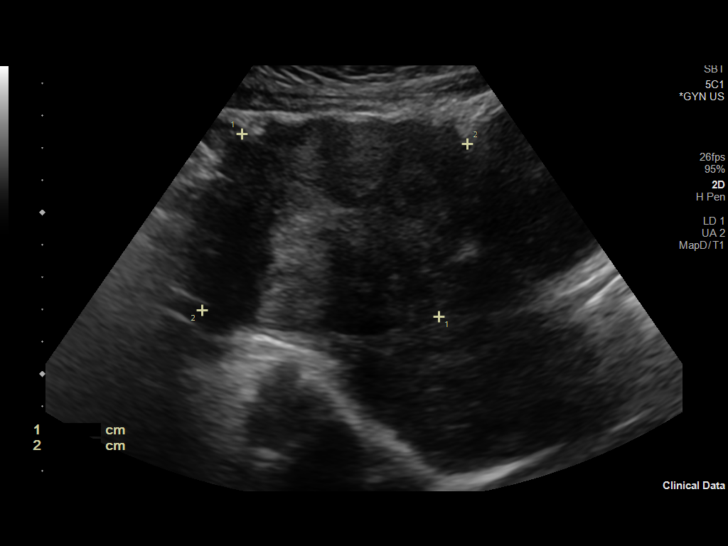
[im 4/44]
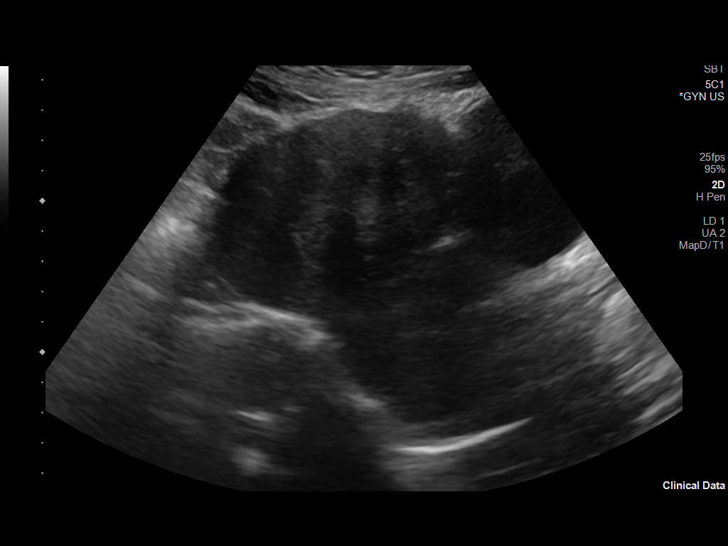
[im 8/44]
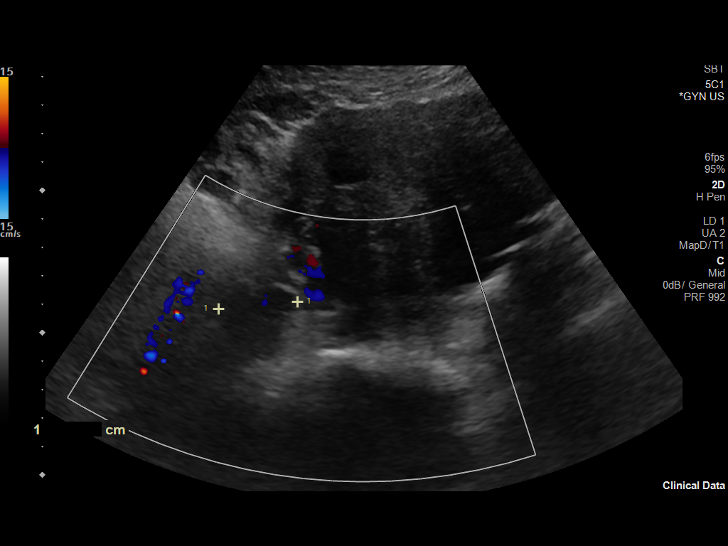
[im 11/44]
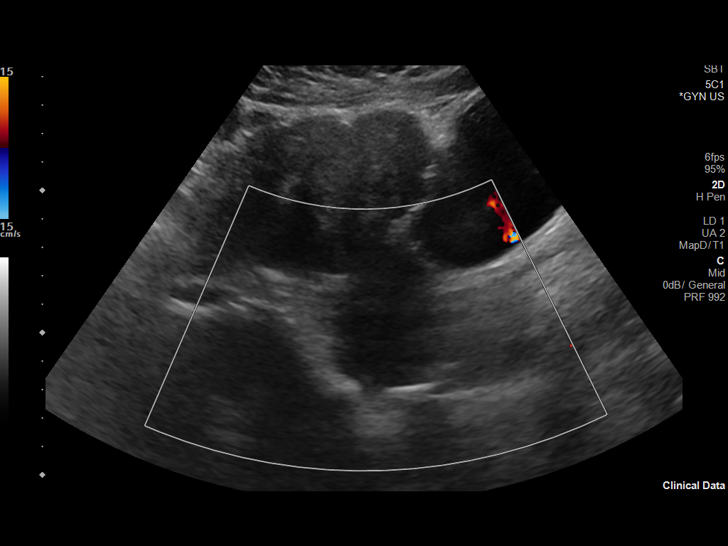
[im 15/44]
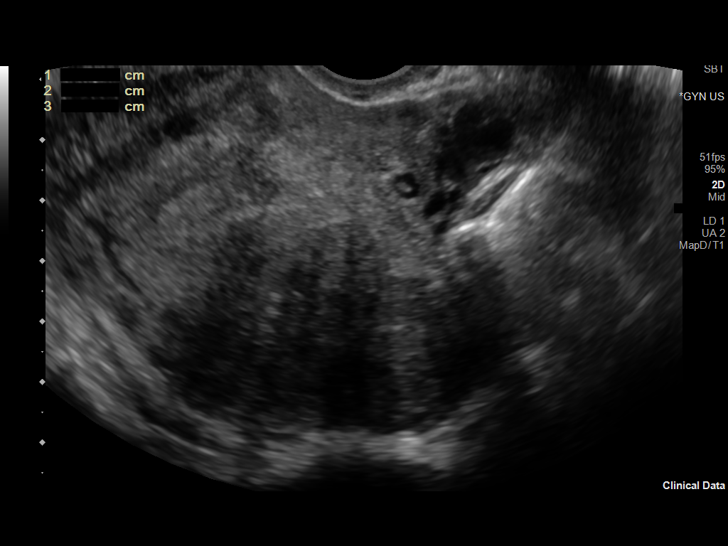
[im 18/44]
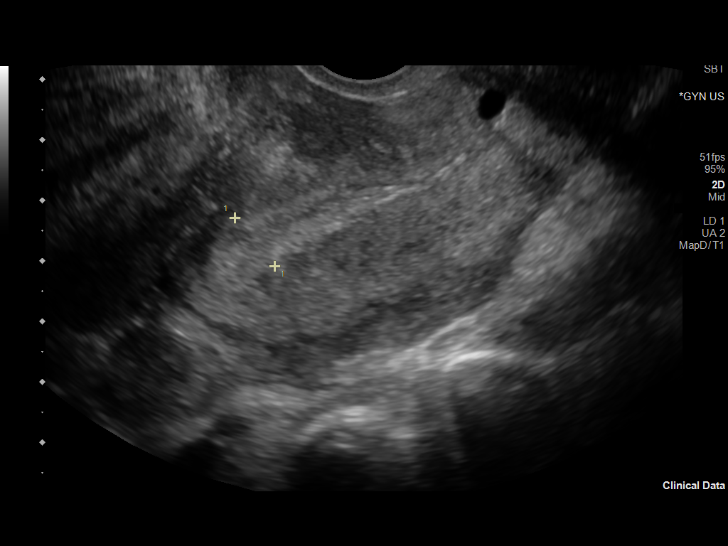
[im 22/44]
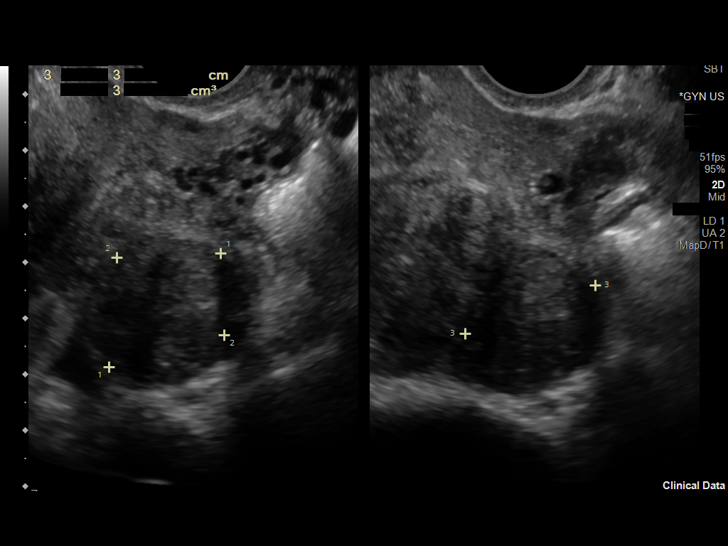
[im 26/44]
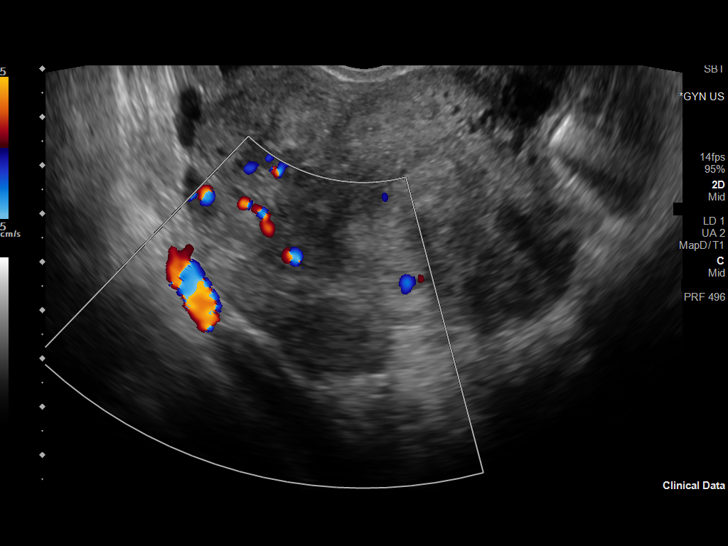
[im 29/44]
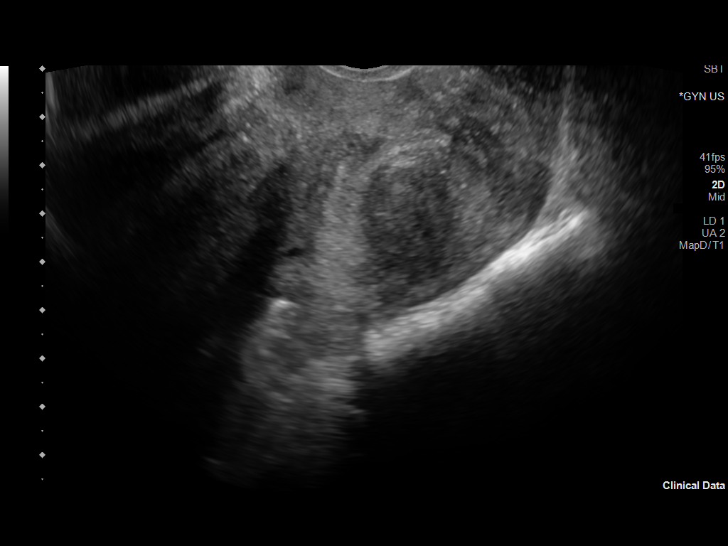
[im 33/44]
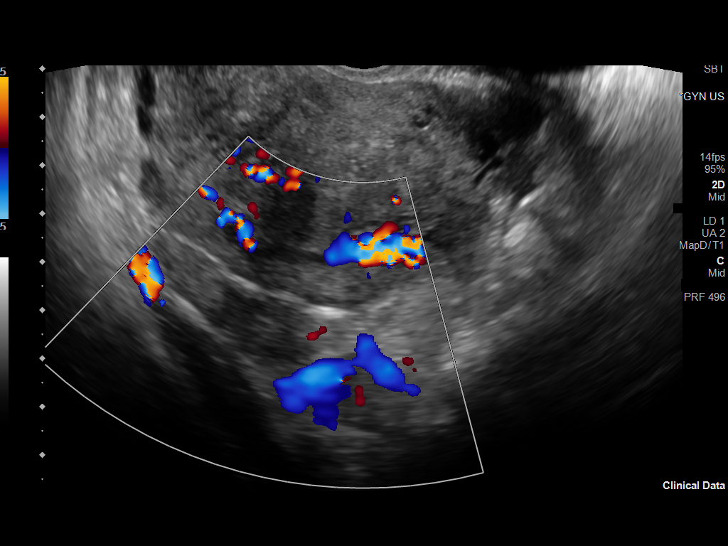
[im 36/44]
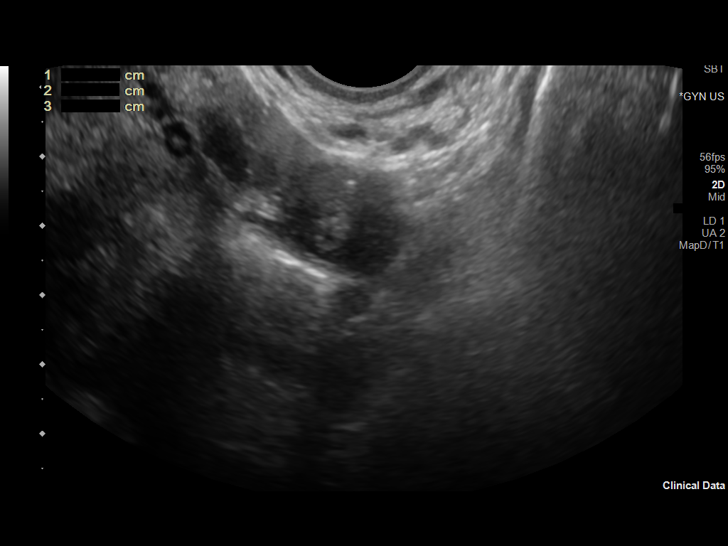
[im 40/44]
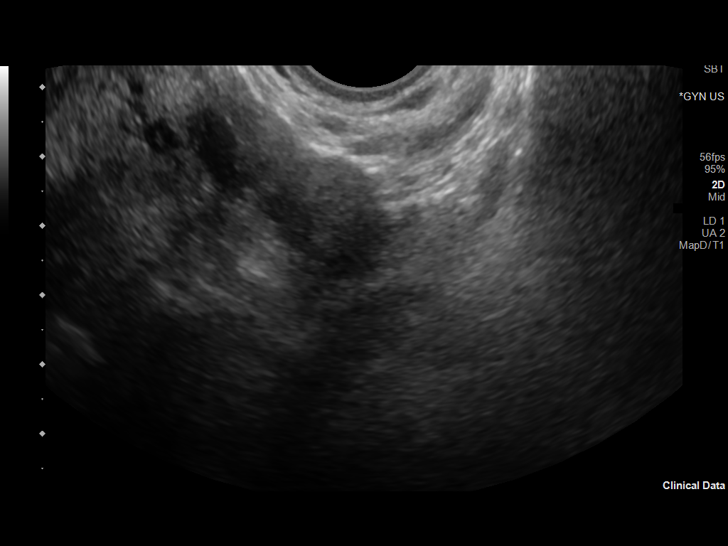
[im 44/44]
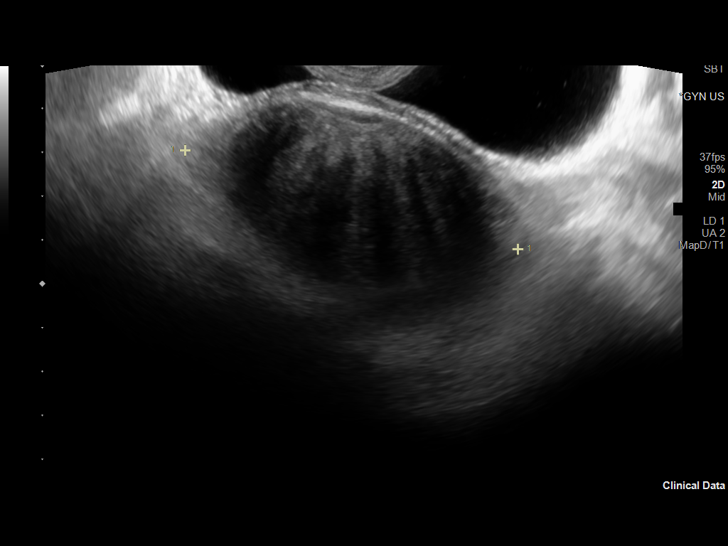

[13 of 25 positions shown; findings below may reference images not displayed]

FINDINGS: Uterus

Measurements: 9.2 x 5.0 x 5.3 cm = volume: 127 mL. Large
pedunculated fibroid extending from the uterine fundus measures
x 9.7 x 9.9 cm, increased in size from previous. Multiple additional
fibroids are now seen as well, including a 3.3 x 3.4 x 3.8 cm
intramural fibroid at the left posterior uterine body. 2.1 x 2.1 x
2.6 intramural fibroid at the left anterior uterine body. 2.9 x
x 2.5 cm subserosal fibroid at the left posterior uterine body.

Endometrium

Thickness: 10 mm.  No focal abnormality visualized.

Right ovary

Measurements: 3.3 x 2.0 x 2.7 cm = volume: 9.6 mL. Normal
appearance/no adnexal mass.

Left ovary

Measurements: 3.4 x 1.9 x 2.2 cm = volume: 7.4 mL. Normal
appearance/no adnexal mass.

Other findings

No abnormal free fluid.
IMPRESSION: 1. Enlarged fibroid uterus as above, with dominant 10 cm
pedunculated fibroid extending from the uterine fundus. Overall,
size and number of these fibroids has increased as compared to most
recent ultrasound from 9166.
2. Otherwise unremarkable and normal pelvic ultrasound.

## 2020-03-26 ENCOUNTER — Other Ambulatory Visit: Payer: Self-pay

## 2020-03-26 ENCOUNTER — Telehealth: Payer: Self-pay

## 2020-03-26 DIAGNOSIS — I10 Essential (primary) hypertension: Secondary | ICD-10-CM

## 2020-03-26 MED ORDER — HYDROCHLOROTHIAZIDE 25 MG PO TABS: 25.0000 mg | ORAL_TABLET | Freq: Every day | ORAL | 0 refills | Status: DC

## 2020-03-26 NOTE — Telephone Encounter (Signed)
Rx sent to patient pharmacy. Patient called and notified.

## 2020-03-26 NOTE — Telephone Encounter (Signed)
..   LAST APPOINTMENT DATE: 07/17/2018  NEXT APPOINTMENT DATE:04/16/2020  MEDICATION: Hydrochlorothiazide 25 mg  PHARMACY:  [facility] [address]   Let patient know to contact pharmacy at the end of the day to make sure medication is ready.  Please notify patient to allow 48-72 hours to process  Encourage patient to contact the pharmacy for refills or they can request refills through [facility]:   LAST REFILL:  QTY:  REFILL DATE:    OTHER COMMENTS:      Okay for refill?  Please advise

## 2020-04-16 ENCOUNTER — Ambulatory Visit (INDEPENDENT_AMBULATORY_CARE_PROVIDER_SITE_OTHER): Payer: 59 | Admitting: Family Medicine

## 2020-04-16 ENCOUNTER — Encounter: Payer: Self-pay | Admitting: Family Medicine

## 2020-04-16 ENCOUNTER — Other Ambulatory Visit: Payer: Self-pay

## 2020-04-16 VITALS — BP 136/80 | HR 84 | Temp 97.8°F | Resp 18 | Ht 63.5 in | Wt 178.8 lb

## 2020-04-16 DIAGNOSIS — E669 Obesity, unspecified: Secondary | ICD-10-CM | POA: Insufficient documentation

## 2020-04-16 DIAGNOSIS — Z0000 Encounter for general adult medical examination without abnormal findings: Secondary | ICD-10-CM | POA: Diagnosis not present

## 2020-04-16 DIAGNOSIS — Z1211 Encounter for screening for malignant neoplasm of colon: Secondary | ICD-10-CM

## 2020-04-16 DIAGNOSIS — K1120 Sialoadenitis, unspecified: Secondary | ICD-10-CM | POA: Insufficient documentation

## 2020-04-16 DIAGNOSIS — E559 Vitamin D deficiency, unspecified: Secondary | ICD-10-CM | POA: Diagnosis not present

## 2020-04-16 LAB — LIPID PANEL
Cholesterol: 230 mg/dL — ABNORMAL HIGH (ref 0–200)
HDL: 102 mg/dL (ref >39.00)
LDL Cholesterol: 119 mg/dL — ABNORMAL HIGH (ref 0–99)
NonHDL: 127.98
Total CHOL/HDL Ratio: 2
Triglycerides: 44 mg/dL (ref 0.0–149.0)
VLDL: 8.8 mg/dL (ref 0.0–40.0)

## 2020-04-16 LAB — HEPATIC FUNCTION PANEL
ALT: 13 U/L (ref 0–35)
AST: 17 U/L (ref 0–37)
Albumin: 4.4 g/dL (ref 3.5–5.2)
Alkaline Phosphatase: 54 U/L (ref 39–117)
Bilirubin, Direct: 0.1 mg/dL (ref 0.0–0.3)
Total Bilirubin: 0.7 mg/dL (ref 0.2–1.2)
Total Protein: 7.6 g/dL (ref 6.0–8.3)

## 2020-04-16 LAB — CBC WITH DIFFERENTIAL/PLATELET
Basophils Absolute: 0.1 10*3/uL (ref 0.0–0.1)
Basophils Relative: 1.1 % (ref 0.0–3.0)
Eosinophils Absolute: 0.2 10*3/uL (ref 0.0–0.7)
Eosinophils Relative: 3.1 % (ref 0.0–5.0)
HCT: 40.6 % (ref 36.0–46.0)
Hemoglobin: 13.1 g/dL (ref 12.0–15.0)
Lymphocytes Relative: 30.7 % (ref 12.0–46.0)
Lymphs Abs: 1.7 10*3/uL (ref 0.7–4.0)
MCHC: 32.3 g/dL (ref 30.0–36.0)
MCV: 84.6 fl (ref 78.0–100.0)
Monocytes Absolute: 0.5 10*3/uL (ref 0.1–1.0)
Monocytes Relative: 9.5 % (ref 3.0–12.0)
Neutro Abs: 3.1 10*3/uL (ref 1.4–7.7)
Neutrophils Relative %: 55.6 % (ref 43.0–77.0)
Platelets: 315 10*3/uL (ref 150.0–400.0)
RBC: 4.8 Mil/uL (ref 3.87–5.11)
RDW: 14.5 % (ref 11.5–15.5)
WBC: 5.5 10*3/uL (ref 4.0–10.5)

## 2020-04-16 LAB — BASIC METABOLIC PANEL
BUN: 13 mg/dL (ref 6–23)
CO2: 29 mEq/L (ref 19–32)
Calcium: 9.8 mg/dL (ref 8.4–10.5)
Chloride: 102 mEq/L (ref 96–112)
Creatinine, Ser: 0.64 mg/dL (ref 0.40–1.20)
GFR: 103.43 mL/min (ref >60.00)
Glucose, Bld: 95 mg/dL (ref 70–99)
Potassium: 4 mEq/L (ref 3.5–5.1)
Sodium: 139 mEq/L (ref 135–145)

## 2020-04-16 LAB — TSH: TSH: 1.8 u[IU]/mL (ref 0.35–4.50)

## 2020-04-16 LAB — VITAMIN D 25 HYDROXY (VIT D DEFICIENCY, FRACTURES): VITD: 15.78 ng/mL — ABNORMAL LOW (ref 30.00–100.00)

## 2020-04-16 MED ORDER — PHENTERMINE HCL 15 MG PO CAPS: 15.0000 mg | ORAL_CAPSULE | ORAL | 0 refills | Status: DC

## 2020-04-16 NOTE — Assessment & Plan Note (Signed)
Pt's PE WNL.  Due for mammo- pt to schedule.  Referral for colonoscopy placed.  Encouraged her to get COVID vaccine.  Check labs.  Anticipatory guidance provided.

## 2020-04-16 NOTE — Patient Instructions (Addendum)
Follow up in 1 year or as needed We'll notify you of your lab results and make any changes if needed Continue to work on healthy diet and regular exercise- you can do it! Call and schedule your mammogram at your convenience We'll call you with GI appt Call with any questions or concerns Stay Safe!  Stay Healthy! HAPPY EARLY BIRTHDAY!!!

## 2020-04-16 NOTE — Progress Notes (Signed)
   Subjective:    Patient ID: Angela Morris, female    DOB: [birth year], 50 y.o.   MRN: [id number]  HPI CPE- UTD on Tdap.  Due for colonoscopy, mammo- pt to schedule.  No need for pap due to hysterectomy.  Pt has not had COVID vaccines and 'isn't sure'.  Reviewed past medical, surgical, family and social histories.   Patient Care Team    Relationship Specialty Notifications Start End  Midge Minium, MD PCP - General   01/13/10   Leo Grosser Seymour Bars, MD (Inactive) Consulting Physician Obstetrics and Gynecology  11/29/16     Health Maintenance  Topic Date Due  . COLONOSCOPY (Pts 45-33yrs Insurance coverage will need to be confirmed)  Never done  . COVID-19 Vaccine (1) 05/02/2020 (Originally 05/27/1975)  . INFLUENZA VACCINE  06/02/2020 (Originally 09/01/2019)  . Hepatitis C Screening  04/16/2021 (Originally [birth year])  . HIV Screening  04/16/2021 (Originally 05/26/1985)  . PAP SMEAR-Modifier  11/27/2021  . TETANUS/TDAP  09/03/2024  . HPV VACCINES  Aged Out      Review of Systems Patient reports no vision/ hearing changes, adenopathy,fever, weight change,  persistant/recurrent hoarseness , swallowing issues, chest pain, palpitations, edema, persistant/recurrent cough, hemoptysis, dyspnea (rest/exertional/paroxysmal nocturnal), gastrointestinal bleeding (melena, rectal bleeding), abdominal pain, significant heartburn, bowel changes, GU symptoms (dysuria, hematuria, incontinence), Gyn symptoms (abnormal  bleeding, pain),  syncope, focal weakness, memory loss, numbness & tingling, skin/hair/nail changes, abnormal bruising or bleeding, anxiety, or depression.   This visit occurred during the SARS-CoV-2 public health emergency.  Safety protocols were in place, including screening questions prior to the visit, additional usage of staff PPE, and extensive cleaning of exam room while observing appropriate contact time as indicated for disinfecting solutions.       Objective:    Physical Exam General Appearance:    Alert, cooperative, no distress, appears stated age  Head:    Normocephalic, without obvious abnormality, atraumatic  Eyes:    PERRL, conjunctiva/corneas clear, EOM's intact, fundi    benign, both eyes  Ears:    Normal TM's and external ear canals, both ears  Nose:   Deferred due to COVID  Throat:   Neck:   Supple, symmetrical, trachea midline, no adenopathy;    Thyroid: no enlargement/tenderness/nodules  Back:     Symmetric, no curvature, ROM normal, no CVA tenderness  Lungs:     Clear to auscultation bilaterally, respirations unlabored  Chest Wall:    No tenderness or deformity   Heart:    Regular rate and rhythm, S1 and S2 normal, no murmur, rub   or gallop  Breast Exam:    Deferred to GYN  Abdomen:     Soft, non-tender, bowel sounds active all four quadrants,    no masses, no organomegaly  Genitalia:    Deferred to GYN  Rectal:    Extremities:   Extremities normal, atraumatic, no cyanosis or edema  Pulses:   2+ and symmetric all extremities  Skin:   Skin color, texture, turgor normal, no rashes or lesions  Lymph nodes:   Cervical, supraclavicular, and axillary nodes normal  Neurologic:   CNII-XII intact, normal strength, sensation and reflexes    throughout          Assessment & Plan:

## 2020-04-16 NOTE — Assessment & Plan Note (Signed)
Pt's BMI is 31.18.  Discussed need for healthy diet and regular exercise.  Pt feels she needs a 'jumpstart' on her weight loss journey.  She has done well on low dose phentermine in the past.  Will start for 3 months and reassess.  Pt expressed understanding and is in agreement w/ plan.

## 2020-04-16 NOTE — Assessment & Plan Note (Signed)
Check labs and replete prn. 

## 2020-04-17 ENCOUNTER — Other Ambulatory Visit: Payer: Self-pay

## 2020-04-17 MED ORDER — VITAMIN D (ERGOCALCIFEROL) 1.25 MG (50000 UNIT) PO CAPS: 50000.0000 [IU] | ORAL_CAPSULE | ORAL | 3 refills | Status: DC

## 2020-04-23 ENCOUNTER — Other Ambulatory Visit: Payer: Self-pay | Admitting: Family Medicine

## 2020-04-23 DIAGNOSIS — I10 Essential (primary) hypertension: Secondary | ICD-10-CM

## 2020-05-20 ENCOUNTER — Other Ambulatory Visit: Payer: Self-pay | Admitting: Family Medicine

## 2020-05-20 NOTE — Telephone Encounter (Signed)
Patient is requesting a refill of the following medications: Requested Prescriptions   Pending Prescriptions Disp Refills  . LORazepam (ATIVAN) 0.5 MG tablet [Pharmacy Med Name: LORAZEPAM 0.5MG  TABLETS] 30 tablet     Sig: TAKE 1/2 TO 1 TABLET BY MOUTH AS NEEDED FOR ANXIETY    Date of patient request:05/20/2020 Last office visit: 04/16/2020 Date of last refill: 05/20/2019 Last refill amount: 30 Follow up time period per chart: none

## 2020-05-20 NOTE — Telephone Encounter (Signed)
Pt needs a refill on the lorazepam, she states that she is leaving to go out of town tomorrow and she needs the medication for her flight.   Please advise

## 2020-05-22 IMAGING — MG DIGITAL SCREENING BILAT W/ CAD
4 series · 4 of 4 positions shown · non-contrast
Comparison: Previous exam(s).

CLINICAL DATA: Screening.

EXAM:
DIGITAL SCREENING BILATERAL MAMMOGRAM WITH CAD

[L CC]
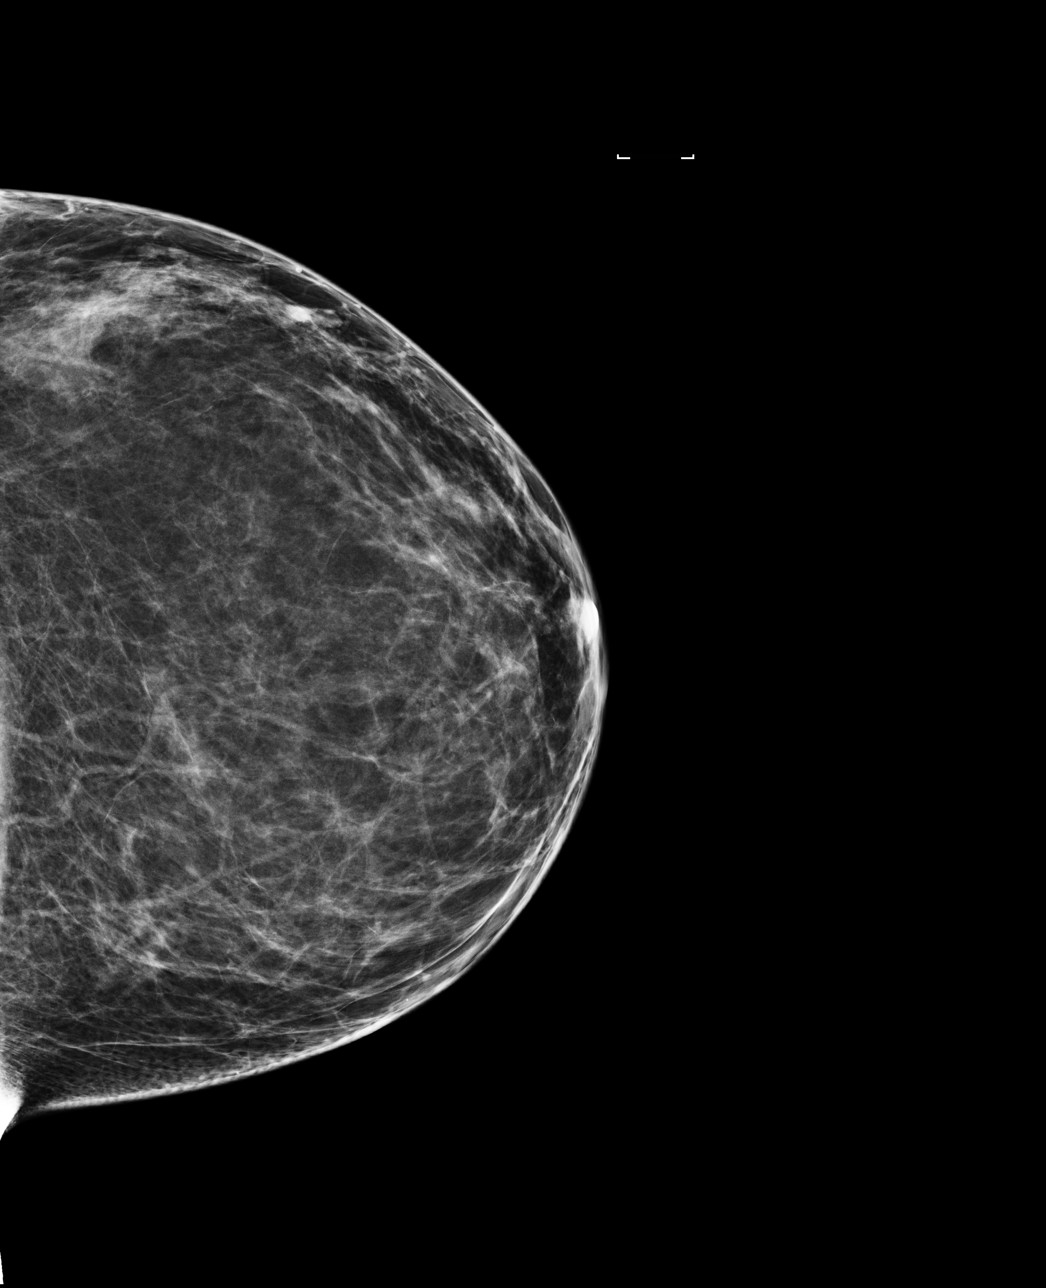

[R CC]
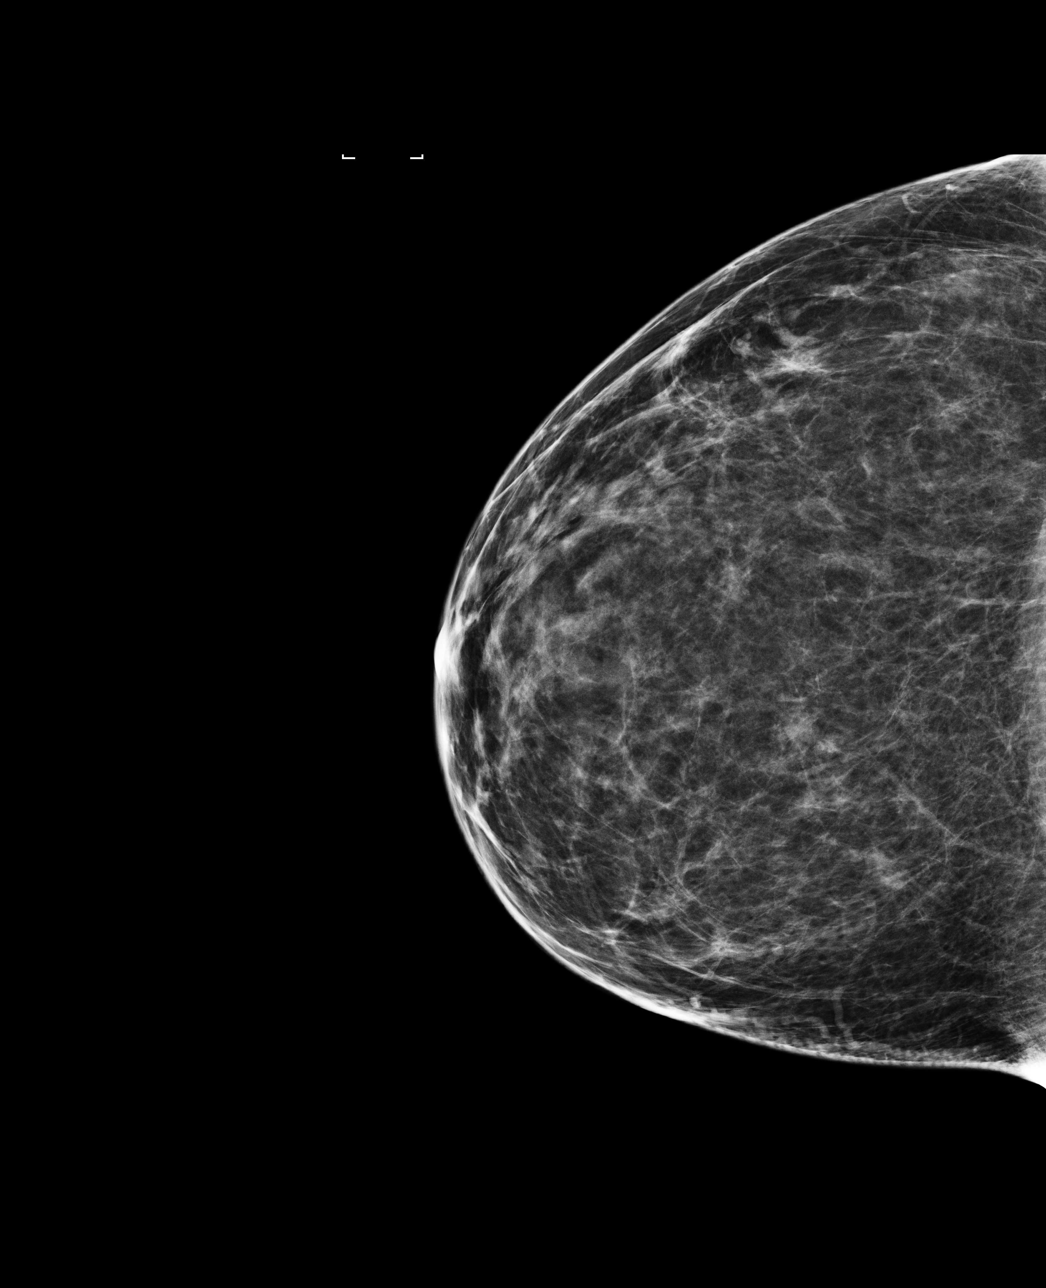

[R MLO]
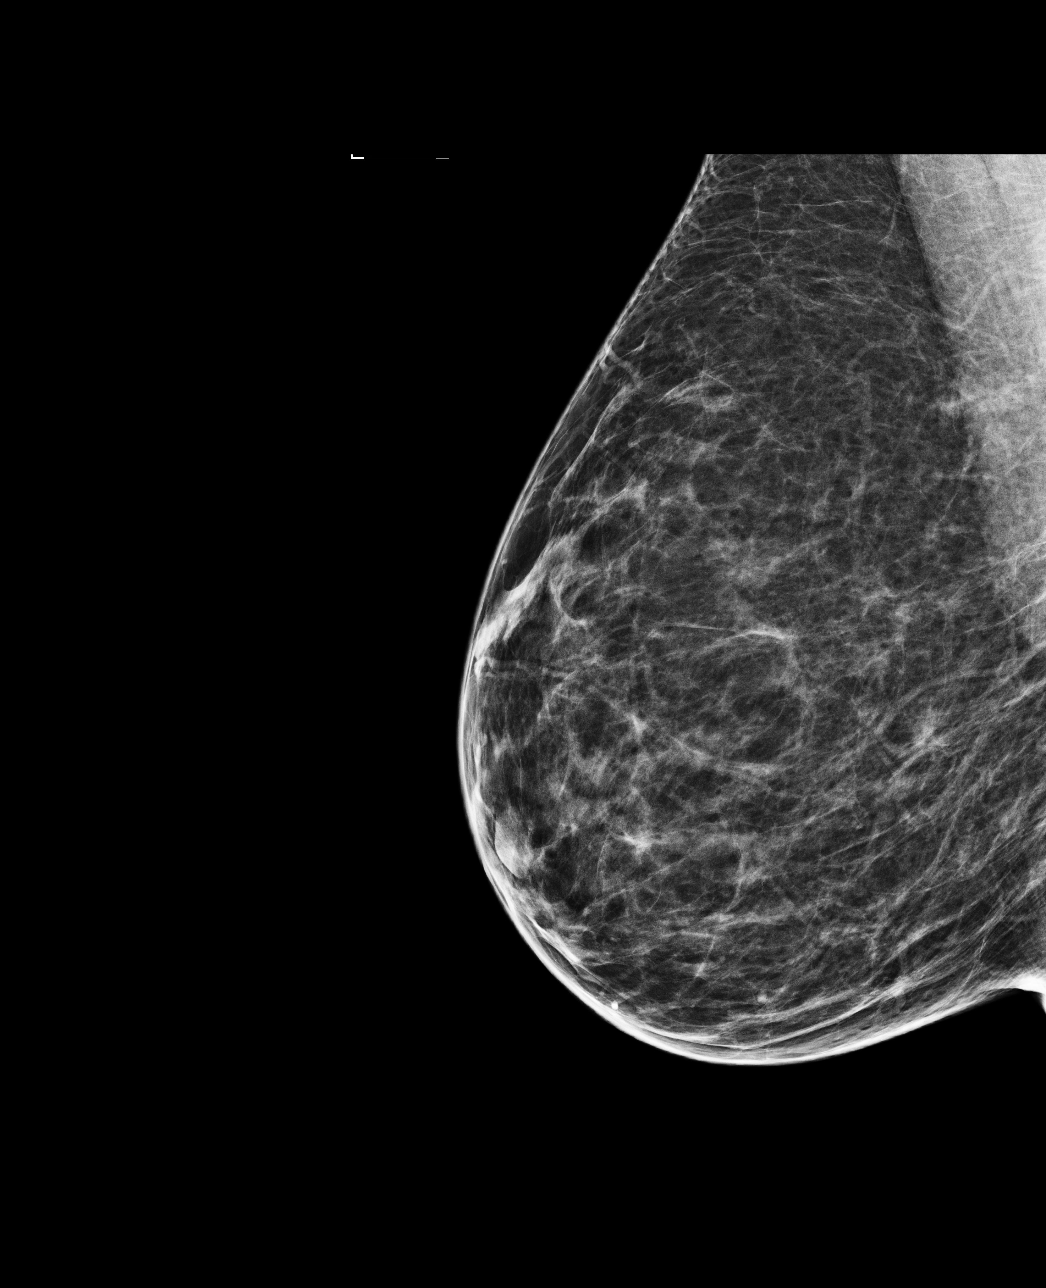

[L MLO]
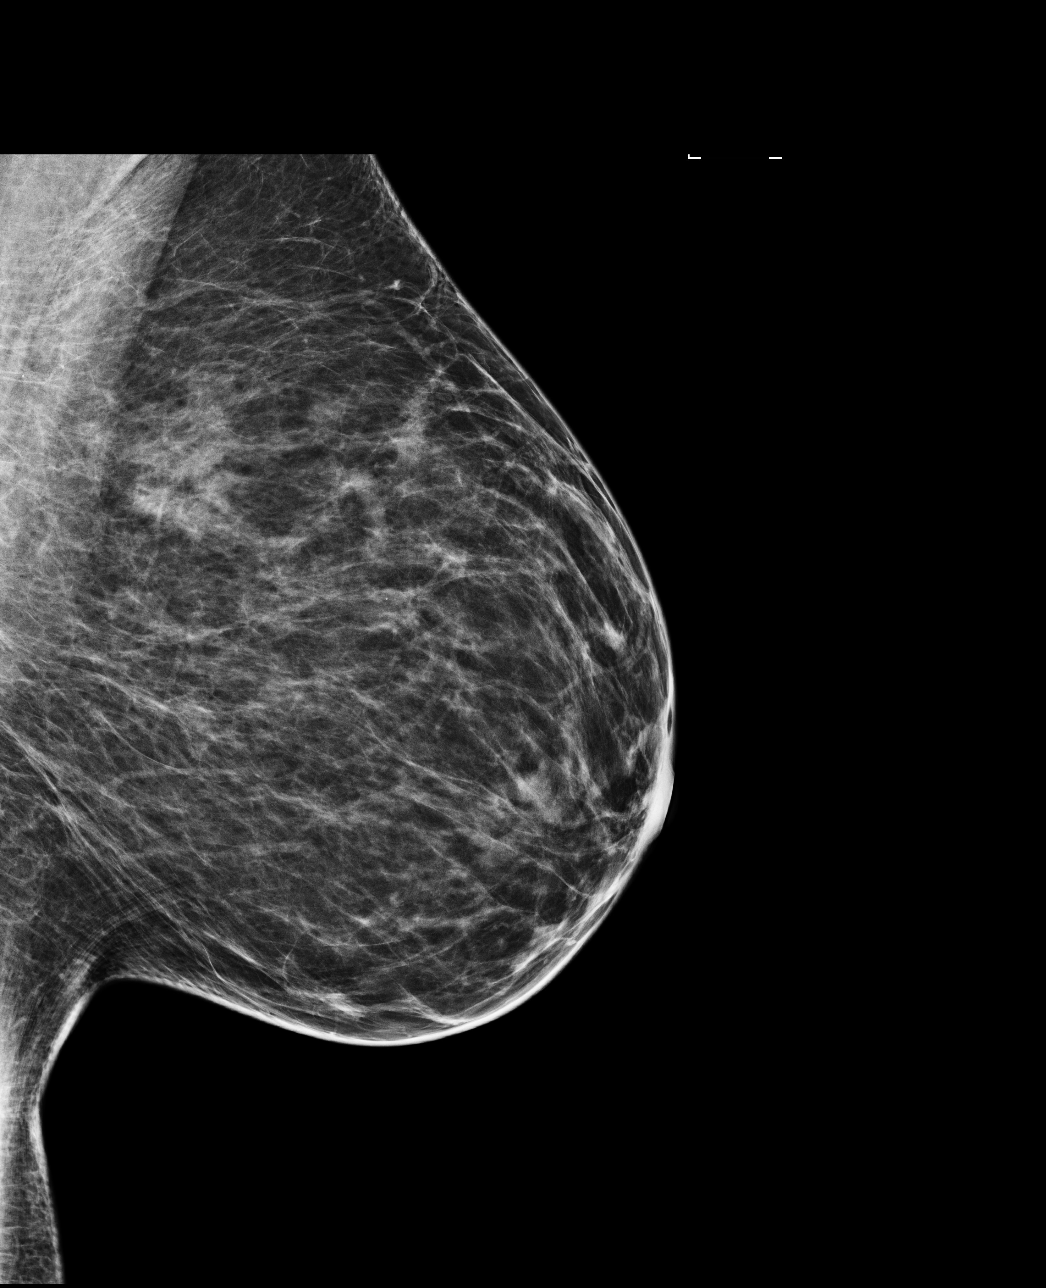

[4 of 4 positions shown; findings below may reference images not displayed]

ACR Breast Density Category b: There are scattered areas of
fibroglandular density.
FINDINGS: There are no findings suspicious for malignancy. Images were
processed with CAD.
IMPRESSION: No mammographic evidence of malignancy. A result letter of this
screening mammogram will be mailed directly to the patient.

RECOMMENDATION:
Screening mammogram in one year. (Code:AS-G-LCT)

BI-RADS CATEGORY  1: Negative.

## 2020-05-24 ENCOUNTER — Other Ambulatory Visit: Payer: Self-pay | Admitting: Family Medicine

## 2020-05-24 DIAGNOSIS — I10 Essential (primary) hypertension: Secondary | ICD-10-CM

## 2020-06-29 IMAGING — US US EXTREM LOW VENOUS*R*
1 series · 14 of 24 positions shown · non-contrast
Comparison: None.

CLINICAL DATA: Right lower extremity pain after surgery.

EXAM:
Right LOWER EXTREMITY VENOUS DOPPLER ULTRASOUND
TECHNIQUE: Gray-scale sonography with compression, as well as color and duplex
ultrasound, were performed to evaluate the deep venous system(s)
from the level of the common femoral vein through the popliteal and
proximal calf veins.

[Series 1: us extrem low venous*right* · 14 of 31 slices shown]
[im 1/31]
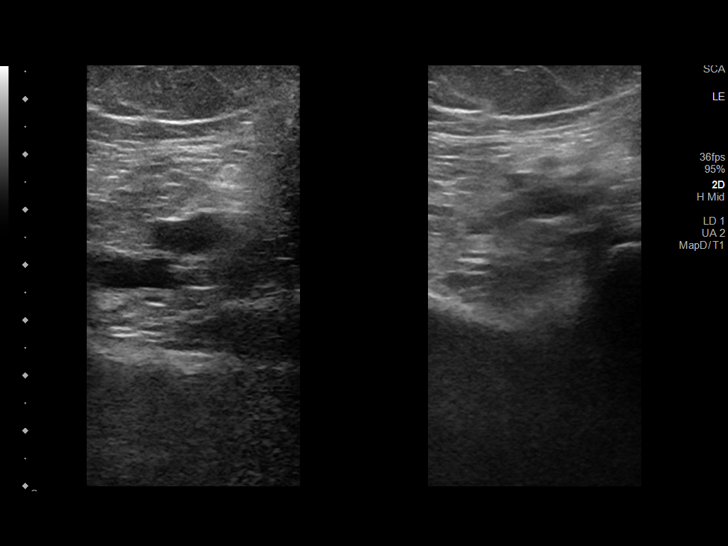
[im 3/31]
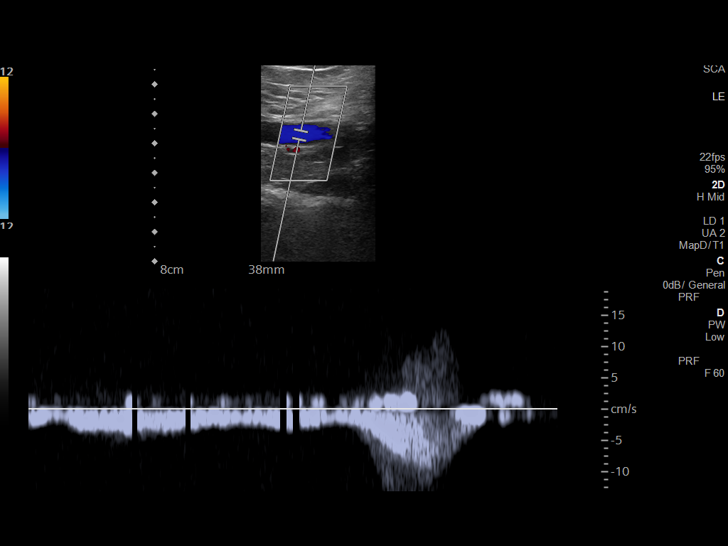
[im 6/31]
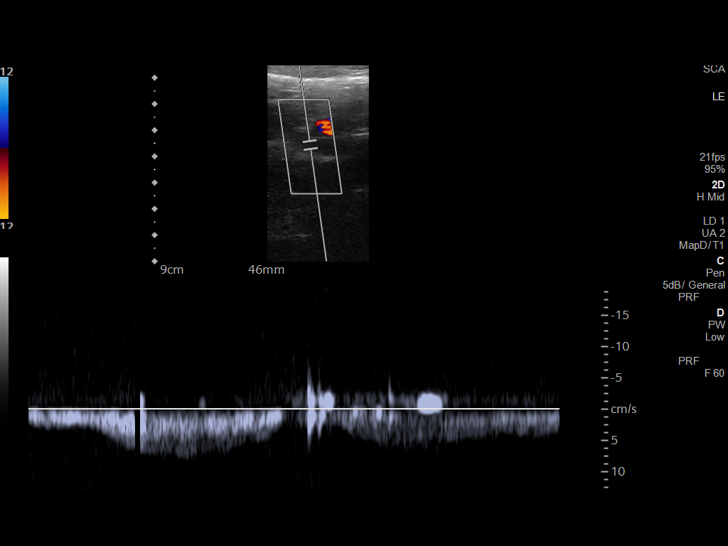
[im 8/31]
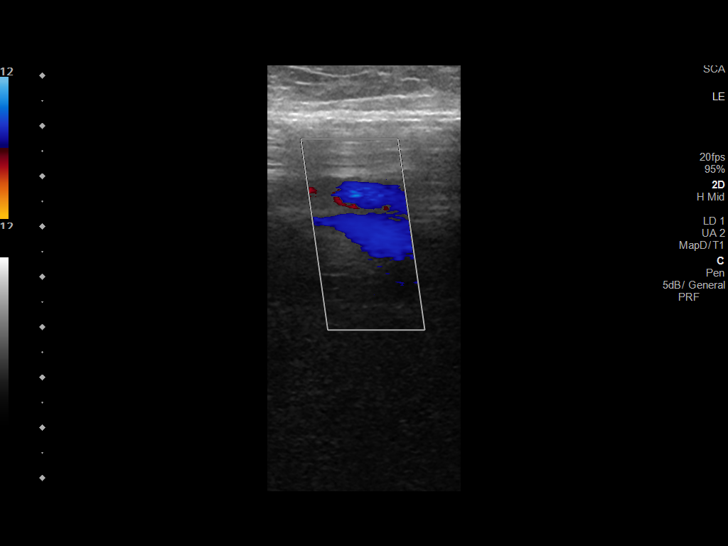
[im 10/31]
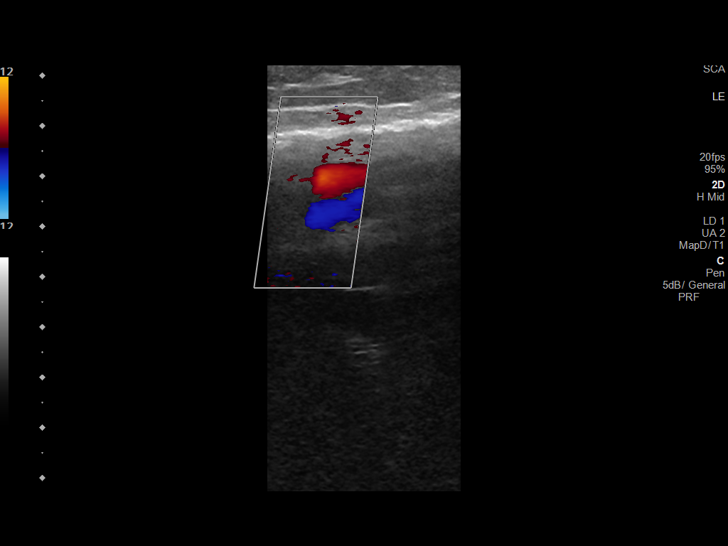
[im 12/31]
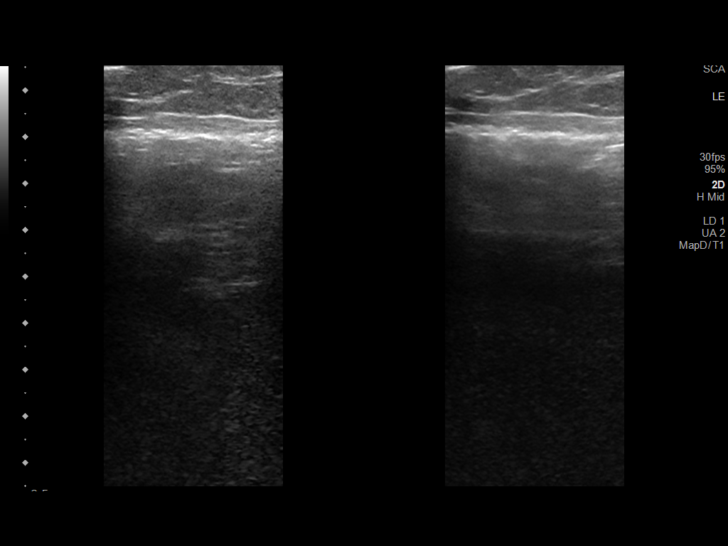
[im 15/31]
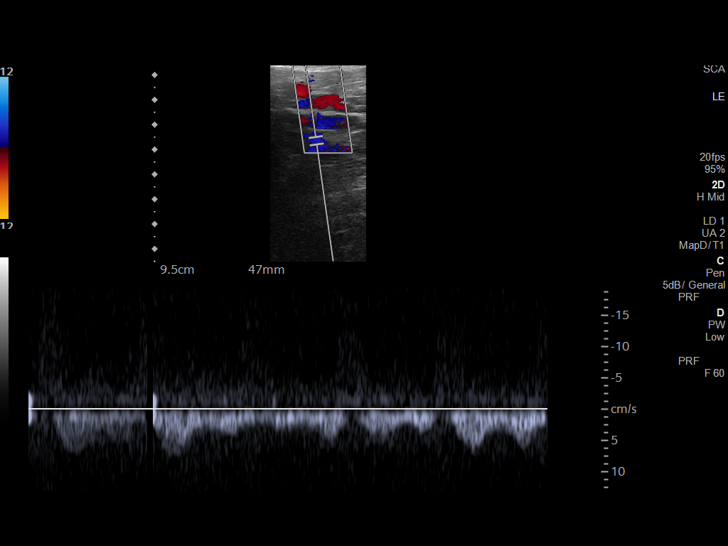
[im 16/31]
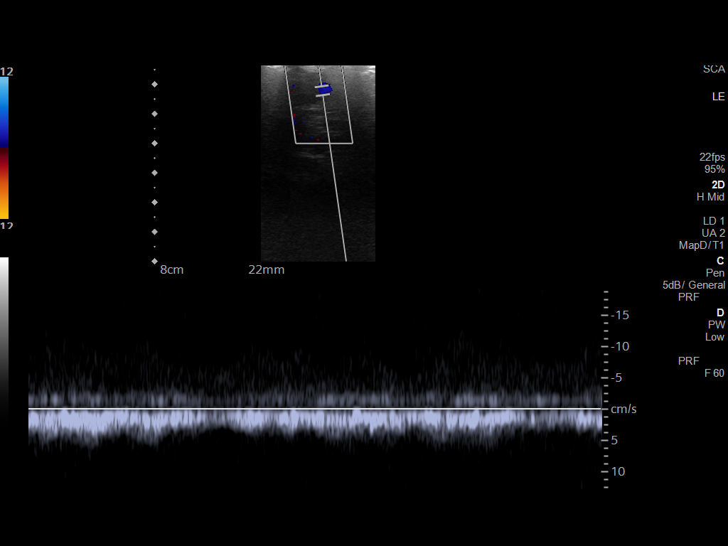
[im 19/31]
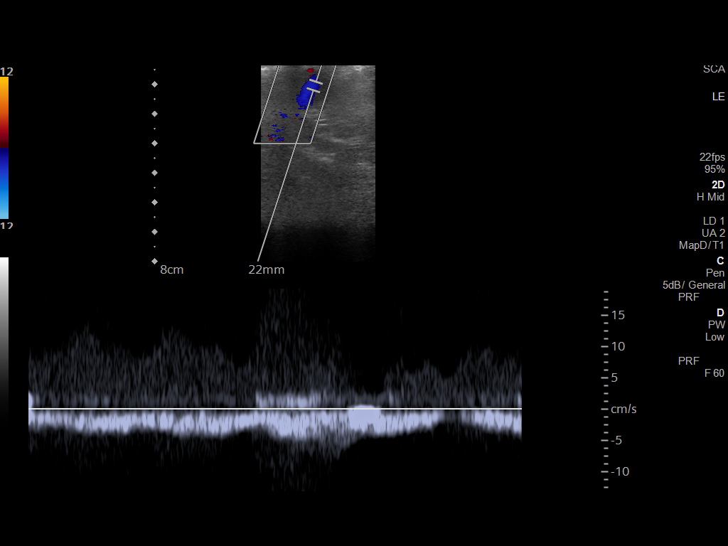
[im 21/31]
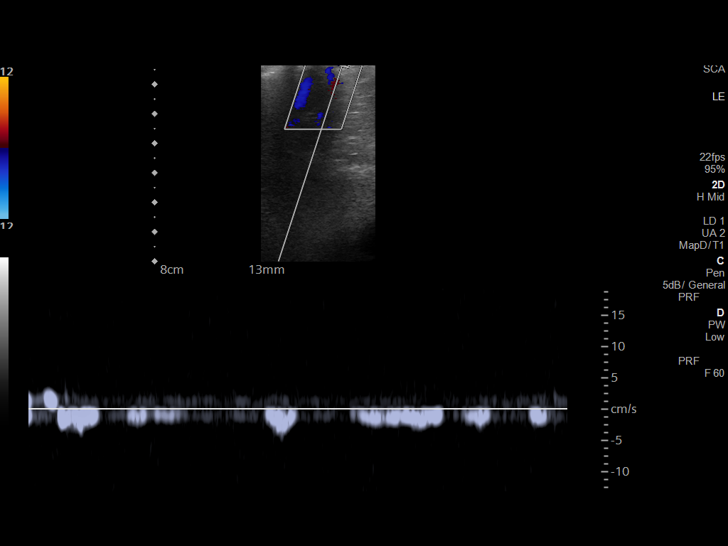
[im 24/31]
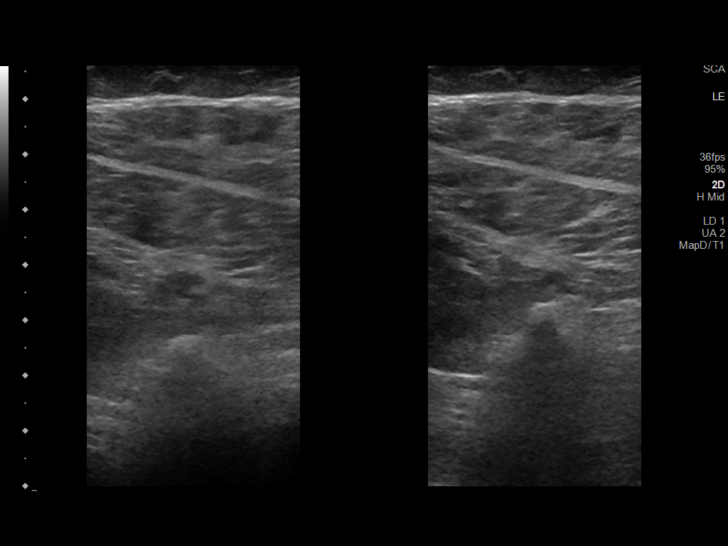
[im 25/31]
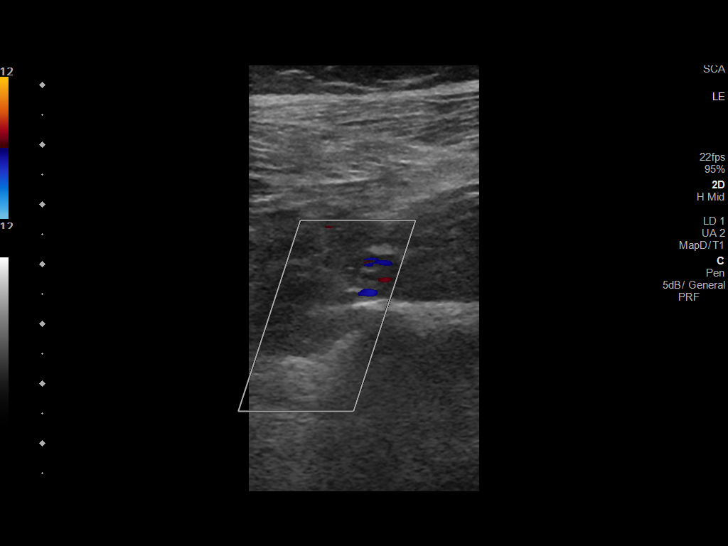
[im 28/31]
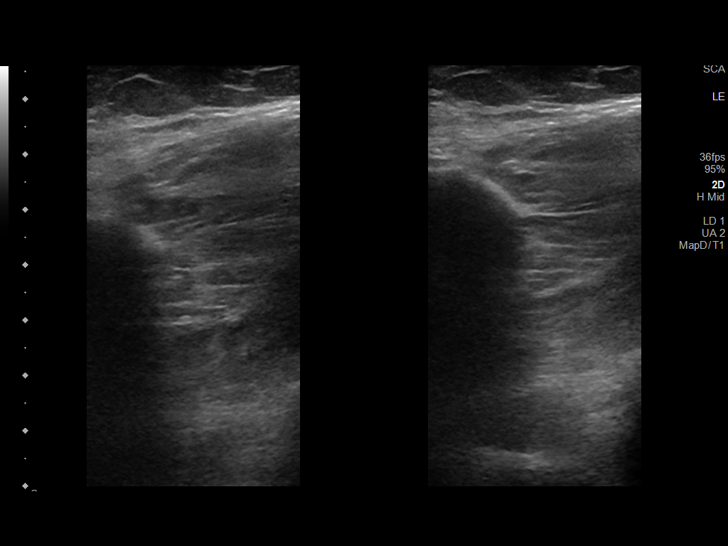
[im 31/31]
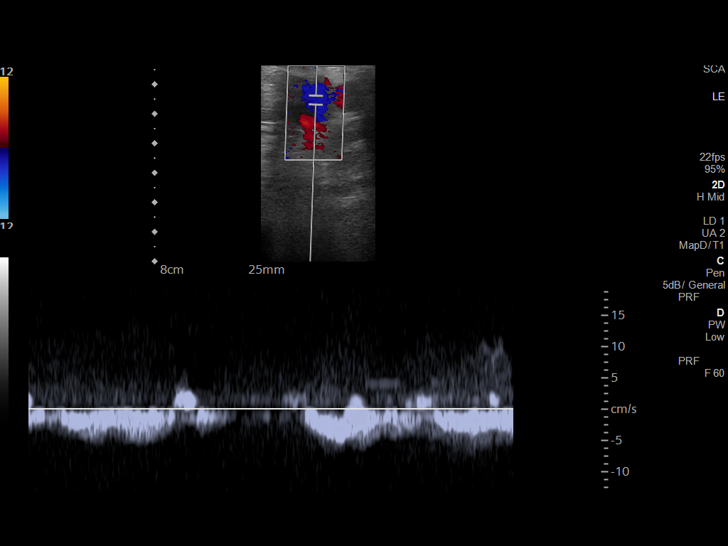

[14 of 24 positions shown; findings below may reference images not displayed]

FINDINGS: VENOUS

Normal compressibility of the common femoral, superficial femoral,
and popliteal veins, as well as the visualized calf veins.
Visualized portions of profunda femoral vein and great saphenous
vein unremarkable. No filling defects to suggest DVT on grayscale or
color Doppler imaging. Doppler waveforms show normal direction of
venous flow, normal respiratory phasicity and response to
augmentation.

Limited views of the contralateral common femoral vein are
unremarkable.

OTHER

None.

Limitations: none
IMPRESSION: No femoropopliteal DVT nor evidence of DVT within the visualized
calf veins.

If clinical symptoms are inconsistent or if there are persistent or
worsening symptoms, further imaging (possibly involving the iliac
veins) may be warranted.

## 2020-07-29 ENCOUNTER — Encounter: Payer: Self-pay | Admitting: *Deleted

## 2020-08-25 ENCOUNTER — Other Ambulatory Visit: Payer: Self-pay

## 2020-08-25 ENCOUNTER — Telehealth: Payer: Self-pay

## 2020-08-25 DIAGNOSIS — I10 Essential (primary) hypertension: Secondary | ICD-10-CM

## 2020-08-25 MED ORDER — HYDROCHLOROTHIAZIDE 25 MG PO TABS: ORAL_TABLET | ORAL | 0 refills | Status: DC

## 2020-08-25 NOTE — Telephone Encounter (Signed)
Rx filled and sent to patient pharmacy 

## 2020-08-25 NOTE — Telephone Encounter (Signed)
Pt needs refill on hydrochlorothiazide (HYDRODIURIL) 25 MG tablet  [id number] - [place], [place] - [address] AT [place] [address]   Pt is going out of town tonight and needs to get medication   Pt call back [phone]

## 2020-09-21 ENCOUNTER — Other Ambulatory Visit: Payer: Self-pay | Admitting: Family Medicine

## 2020-09-21 DIAGNOSIS — I10 Essential (primary) hypertension: Secondary | ICD-10-CM

## 2020-12-29 ENCOUNTER — Other Ambulatory Visit: Payer: Self-pay

## 2020-12-29 DIAGNOSIS — I10 Essential (primary) hypertension: Secondary | ICD-10-CM

## 2020-12-29 MED ORDER — HYDROCHLOROTHIAZIDE 25 MG PO TABS: ORAL_TABLET | ORAL | 0 refills | Status: DC

## 2021-03-04 ENCOUNTER — Other Ambulatory Visit: Payer: Self-pay | Admitting: Family Medicine

## 2021-04-05 ENCOUNTER — Other Ambulatory Visit: Payer: Self-pay

## 2021-04-05 DIAGNOSIS — I10 Essential (primary) hypertension: Secondary | ICD-10-CM

## 2021-04-05 MED ORDER — HYDROCHLOROTHIAZIDE 25 MG PO TABS: ORAL_TABLET | ORAL | 0 refills | Status: DC

## 2021-04-28 ENCOUNTER — Encounter: Payer: 59 | Admitting: Family Medicine

## 2021-06-14 ENCOUNTER — Other Ambulatory Visit: Payer: Self-pay

## 2021-06-14 DIAGNOSIS — I10 Essential (primary) hypertension: Secondary | ICD-10-CM

## 2021-06-14 MED ORDER — HYDROCHLOROTHIAZIDE 25 MG PO TABS: 25.0000 mg | ORAL_TABLET | Freq: Every day | ORAL | 3 refills | Status: DC

## 2021-10-25 ENCOUNTER — Encounter: Payer: 59 | Admitting: Family Medicine

## 2021-11-05 ENCOUNTER — Encounter: Payer: Self-pay | Admitting: Family Medicine

## 2021-11-05 ENCOUNTER — Ambulatory Visit (INDEPENDENT_AMBULATORY_CARE_PROVIDER_SITE_OTHER): Payer: 59 | Admitting: Family Medicine

## 2021-11-05 VITALS — BP 120/72 | HR 82 | Temp 97.9°F | Resp 16 | Ht 63.5 in | Wt 183.4 lb

## 2021-11-05 DIAGNOSIS — Z1231 Encounter for screening mammogram for malignant neoplasm of breast: Secondary | ICD-10-CM | POA: Diagnosis not present

## 2021-11-05 DIAGNOSIS — Z0000 Encounter for general adult medical examination without abnormal findings: Secondary | ICD-10-CM

## 2021-11-05 DIAGNOSIS — I10 Essential (primary) hypertension: Secondary | ICD-10-CM | POA: Diagnosis not present

## 2021-11-05 DIAGNOSIS — E559 Vitamin D deficiency, unspecified: Secondary | ICD-10-CM | POA: Diagnosis not present

## 2021-11-05 DIAGNOSIS — Z1211 Encounter for screening for malignant neoplasm of colon: Secondary | ICD-10-CM

## 2021-11-05 LAB — CBC WITH DIFFERENTIAL/PLATELET
Basophils Absolute: 0.1 10*3/uL (ref 0.0–0.1)
Basophils Relative: 1.1 % (ref 0.0–3.0)
Eosinophils Absolute: 0.2 10*3/uL (ref 0.0–0.7)
Eosinophils Relative: 2.6 % (ref 0.0–5.0)
HCT: 37.8 % (ref 36.0–46.0)
Hemoglobin: 12.3 g/dL (ref 12.0–15.0)
Lymphocytes Relative: 28.5 % (ref 12.0–46.0)
Lymphs Abs: 1.8 10*3/uL (ref 0.7–4.0)
MCHC: 32.7 g/dL (ref 30.0–36.0)
MCV: 85.5 fl (ref 78.0–100.0)
Monocytes Absolute: 0.5 10*3/uL (ref 0.1–1.0)
Monocytes Relative: 8.7 % (ref 3.0–12.0)
Neutro Abs: 3.7 10*3/uL (ref 1.4–7.7)
Neutrophils Relative %: 59.1 % (ref 43.0–77.0)
Platelets: 293 10*3/uL (ref 150.0–400.0)
RBC: 4.42 Mil/uL (ref 3.87–5.11)
RDW: 14 % (ref 11.5–15.5)
WBC: 6.3 10*3/uL (ref 4.0–10.5)

## 2021-11-05 LAB — VITAMIN D 25 HYDROXY (VIT D DEFICIENCY, FRACTURES): VITD: 17.1 ng/mL — ABNORMAL LOW (ref 30.00–100.00)

## 2021-11-05 LAB — BASIC METABOLIC PANEL
BUN: 14 mg/dL (ref 6–23)
CO2: 26 mEq/L (ref 19–32)
Calcium: 9.4 mg/dL (ref 8.4–10.5)
Chloride: 103 mEq/L (ref 96–112)
Creatinine, Ser: 0.66 mg/dL (ref 0.40–1.20)
GFR: 101.55 mL/min (ref >60.00)
Glucose, Bld: 87 mg/dL (ref 70–99)
Potassium: 3.7 mEq/L (ref 3.5–5.1)
Sodium: 138 mEq/L (ref 135–145)

## 2021-11-05 LAB — HEPATIC FUNCTION PANEL
ALT: 10 U/L (ref 0–35)
AST: 16 U/L (ref 0–37)
Albumin: 4.5 g/dL (ref 3.5–5.2)
Alkaline Phosphatase: 46 U/L (ref 39–117)
Bilirubin, Direct: 0.1 mg/dL (ref 0.0–0.3)
Total Bilirubin: 0.7 mg/dL (ref 0.2–1.2)
Total Protein: 7.6 g/dL (ref 6.0–8.3)

## 2021-11-05 LAB — TSH: TSH: 2.05 u[IU]/mL (ref 0.35–5.50)

## 2021-11-05 LAB — LIPID PANEL
Cholesterol: 203 mg/dL — ABNORMAL HIGH (ref 0–200)
HDL: 93.9 mg/dL (ref >39.00)
LDL Cholesterol: 97 mg/dL (ref 0–99)
NonHDL: 109.12
Total CHOL/HDL Ratio: 2
Triglycerides: 61 mg/dL (ref 0.0–149.0)
VLDL: 12.2 mg/dL (ref 0.0–40.0)

## 2021-11-05 MED ORDER — SPIRONOLACTONE 25 MG PO TABS: 25.0000 mg | ORAL_TABLET | Freq: Every day | ORAL | 3 refills | Status: DC

## 2021-11-05 NOTE — Assessment & Plan Note (Signed)
Check labs and replete prn. 

## 2021-11-05 NOTE — Patient Instructions (Addendum)
Follow up in 6 weeks to recheck BP We'll notify you of your lab results and make any changes if needed Continue to work on healthy diet and regular exercise- you can do it!!! Complete the cologuard and return as directed Call and schedule your mammogram- the order is in STOP the HCTZ START the spironolactone daily Call with any questions or concerns Stay Safe!  Stay Healthy! Happy Fall!!!

## 2021-11-05 NOTE — Assessment & Plan Note (Signed)
Pt's PE WNL w/ exception of BMI.  Due for mammo- ordered.  Due for colon cancer screening- elects to do cologuard.  UTD on pap and Tdap. Declines flu.  Check labs.  Anticipatory guidance provided.

## 2021-11-05 NOTE — Assessment & Plan Note (Signed)
Chronic problem.  Excellent control today.  She would like to switch the HCTZ to Spironolactone due to thinning hair.  Will start at '25mg'$  daily and monitor for appropriate BP control.  Pt expressed understanding and is in agreement w/ plan.

## 2021-11-05 NOTE — Progress Notes (Signed)
   Subjective:    Patient ID: Angela Morris, female    DOB: [birth year], 51 y.o.   MRN: [id number]  HPI CPE- due for colon cancer screen, mammo.  UTD on pap, Tdap.  Declines flu  Patient Care Team    Relationship Specialty Notifications Start End  Midge Minium, MD PCP - General   01/13/10   Eldred Manges, MD (Inactive) Consulting Physician Obstetrics and Gynecology  11/29/16     Health Maintenance  Topic Date Due  . COLONOSCOPY (Pts 45-47yr Insurance coverage will need to be confirmed)  Never done  . MAMMOGRAM  01/27/2021  . PAP SMEAR-Modifier  11/27/2021  . INFLUENZA VACCINE  05/01/2022 (Originally 08/31/2021)  . TETANUS/TDAP  09/03/2024  . HPV VACCINES  Aged Out  . COVID-19 Vaccine  Discontinued  . Hepatitis C Screening  Discontinued  . HIV Screening  Discontinued  . Zoster Vaccines- Shingrix  Discontinued      Review of Systems Patient reports no vision/ hearing changes, adenopathy,fever, weight change,  persistant/recurrent hoarseness , swallowing issues, chest pain, palpitations, edema, persistant/recurrent cough, hemoptysis, dyspnea (rest/exertional/paroxysmal nocturnal), gastrointestinal bleeding (melena, rectal bleeding), abdominal pain, significant heartburn, bowel changes, GU symptoms (dysuria, hematuria, incontinence), Gyn symptoms (abnormal  bleeding, pain),  syncope, focal weakness, memory loss, numbness & tingling, skin/hair/nail changes, abnormal bruising or bleeding, anxiety, or depression.     Objective:   Physical Exam General Appearance:    Alert, cooperative, no distress, appears stated age  Head:    Normocephalic, without obvious abnormality, atraumatic  Eyes:    PERRL, conjunctiva/corneas clear, EOM's intact both eyes  Ears:    Normal TM's and external ear canals, both ears  Nose:   Nares normal, septum midline, mucosa normal, no drainage    or sinus tenderness  Throat:   Lips, mucosa, and tongue normal; teeth and gums normal  Neck:    Supple, symmetrical, trachea midline, no adenopathy;    Thyroid: no enlargement/tenderness/nodules  Back:     Symmetric, no curvature, ROM normal, no CVA tenderness  Lungs:     Clear to auscultation bilaterally, respirations unlabored  Chest Wall:    No tenderness or deformity   Heart:    Regular rate and rhythm, S1 and S2 normal, no murmur, rub   or gallop  Breast Exam:    Deferred to GYN  Abdomen:     Soft, non-tender, bowel sounds active all four quadrants,    no masses, no organomegaly  Genitalia:    Deferred to GYN  Rectal:    Extremities:   Extremities normal, atraumatic, no cyanosis or edema  Pulses:   2+ and symmetric all extremities  Skin:   Skin color, texture, turgor normal, no rashes or lesions  Lymph nodes:   Cervical, supraclavicular, and axillary nodes normal  Neurologic:   CNII-XII intact, normal strength, sensation and reflexes    throughout          Assessment & Plan:

## 2021-11-08 ENCOUNTER — Other Ambulatory Visit: Payer: Self-pay

## 2021-11-08 ENCOUNTER — Telehealth: Payer: Self-pay

## 2021-11-08 MED ORDER — VITAMIN D (ERGOCALCIFEROL) 1.25 MG (50000 UNIT) PO CAPS: 50000.0000 [IU] | ORAL_CAPSULE | ORAL | 12 refills | Status: DC

## 2021-11-08 NOTE — Telephone Encounter (Signed)
Informed pt of lab results and Vit D has been sent in

## 2021-11-08 NOTE — Telephone Encounter (Signed)
-----   Message from Midge Minium, MD sent at 11/08/2021  7:32 AM EDT ----- Labs look great w/ exception of low Vit D.  Based on this, we need to start prescription 50,000 units weekly x12 weeks in addition to daily OTC supplement of at least 2000 units.   Cholesterol looks fantastic!!!

## 2021-12-08 ENCOUNTER — Telehealth: Payer: Self-pay

## 2021-12-08 LAB — COLOGUARD: COLOGUARD: NEGATIVE

## 2021-12-08 NOTE — Telephone Encounter (Signed)
Left pt a vm to call office in regards to lab results

## 2021-12-08 NOTE — Telephone Encounter (Signed)
-----   Message from Midge Minium, MD sent at 12/08/2021 10:45 AM EST ----- Normal cologuard- great news!!!

## 2022-01-03 ENCOUNTER — Ambulatory Visit
Admission: RE | Admit: 2022-01-03 | Discharge: 2022-01-03 | Disposition: A | Discharge status: Home / Self Care | Payer: 59 | Source: Ambulatory Visit | Attending: Family Medicine | Admitting: Family Medicine

## 2022-01-03 DIAGNOSIS — Z1231 Encounter for screening mammogram for malignant neoplasm of breast: Secondary | ICD-10-CM

## 2022-02-04 ENCOUNTER — Other Ambulatory Visit: Payer: Self-pay | Admitting: Family Medicine

## 2022-02-04 DIAGNOSIS — F419 Anxiety disorder, unspecified: Secondary | ICD-10-CM

## 2022-02-04 MED ORDER — LORAZEPAM 0.5 MG PO TABS: 0.5000 mg | ORAL_TABLET | Freq: Two times a day (BID) | ORAL | 1 refills | Status: DC | PRN

## 2022-02-04 NOTE — Telephone Encounter (Signed)
Lorazepam 0.5 mg LOV: 11/05/21 Last Refill:03/04/21 Upcoming appt: none

## 2022-02-04 NOTE — Telephone Encounter (Signed)
Informed pt that rx has been sent

## 2022-02-04 NOTE — Telephone Encounter (Signed)
Encourage patient to contact the pharmacy for refills or they can request refills through [facility]  (Please schedule appointment if patient has not been seen in over a year)  Last ov was 11/05/21  WHAT PHARMACY WOULD THEY LIKE THIS SENT TO: [id number] - [place],  - [address] AT [place] NAME & DOSE: lorazepam 0.5 mg   NOTES/COMMENTS FROM PATIENT:      [place] office please notify patient: It takes 48-72 hours to process rx refill requests Ask patient to call pharmacy to ensure rx is ready before heading there.

## 2022-04-21 ENCOUNTER — Ambulatory Visit: Payer: 59 | Admitting: Family Medicine

## 2022-04-21 ENCOUNTER — Telehealth: Payer: Self-pay

## 2022-04-21 ENCOUNTER — Encounter: Payer: Self-pay | Admitting: Family Medicine

## 2022-04-21 VITALS — BP 126/80 | HR 77 | Temp 98.2°F | Resp 17 | Ht 63.5 in | Wt 179.5 lb

## 2022-04-21 DIAGNOSIS — M79671 Pain in right foot: Secondary | ICD-10-CM | POA: Diagnosis not present

## 2022-04-21 DIAGNOSIS — E669 Obesity, unspecified: Secondary | ICD-10-CM | POA: Diagnosis not present

## 2022-04-21 DIAGNOSIS — I10 Essential (primary) hypertension: Secondary | ICD-10-CM | POA: Diagnosis not present

## 2022-04-21 DIAGNOSIS — M79672 Pain in left foot: Secondary | ICD-10-CM | POA: Diagnosis not present

## 2022-04-21 LAB — CBC WITH DIFFERENTIAL/PLATELET
Basophils Absolute: 0.1 10*3/uL (ref 0.0–0.1)
Basophils Relative: 1.9 % (ref 0.0–3.0)
Eosinophils Absolute: 0.2 10*3/uL (ref 0.0–0.7)
Eosinophils Relative: 4.4 % (ref 0.0–5.0)
HCT: 40.1 % (ref 36.0–46.0)
Hemoglobin: 12.9 g/dL (ref 12.0–15.0)
Lymphocytes Relative: 27.7 % (ref 12.0–46.0)
Lymphs Abs: 1.6 10*3/uL (ref 0.7–4.0)
MCHC: 32 g/dL (ref 30.0–36.0)
MCV: 84.9 fl (ref 78.0–100.0)
Monocytes Absolute: 0.6 10*3/uL (ref 0.1–1.0)
Monocytes Relative: 9.8 % (ref 3.0–12.0)
Neutro Abs: 3.2 10*3/uL (ref 1.4–7.7)
Neutrophils Relative %: 56.2 % (ref 43.0–77.0)
Platelets: 290 10*3/uL (ref 150.0–400.0)
RBC: 4.72 Mil/uL (ref 3.87–5.11)
RDW: 14.4 % (ref 11.5–15.5)
WBC: 5.6 10*3/uL (ref 4.0–10.5)

## 2022-04-21 LAB — BASIC METABOLIC PANEL
BUN: 11 mg/dL (ref 6–23)
CO2: 28 mEq/L (ref 19–32)
Calcium: 9.8 mg/dL (ref 8.4–10.5)
Chloride: 103 mEq/L (ref 96–112)
Creatinine, Ser: 0.72 mg/dL (ref 0.40–1.20)
GFR: 96.48 mL/min (ref >60.00)
Glucose, Bld: 87 mg/dL (ref 70–99)
Potassium: 4 mEq/L (ref 3.5–5.1)
Sodium: 138 mEq/L (ref 135–145)

## 2022-04-21 NOTE — Telephone Encounter (Signed)
-----   Message from Midge Minium, MD sent at 04/21/2022  1:14 PM EDT ----- Labs look great!  No changes at this time

## 2022-04-21 NOTE — Assessment & Plan Note (Signed)
Ongoing issue for pt.  She is down 5 lbs since last visit.  Applauded her efforts at diet and exercise.  Will continue to follow.

## 2022-04-21 NOTE — Patient Instructions (Signed)
Schedule your complete physical in 6 months We'll notify you of your lab results and make any changes if needed Keep up the good work on healthy diet and regular exercise- you look great!!! Call with any questions or concerns Stay Safe!  Stay Healthy! Happy Early Birthday!!! 

## 2022-04-21 NOTE — Progress Notes (Signed)
   Subjective:    Patient ID: Angela Morris, female    DOB: 1970/10/19, 52 y.o.   MRN: [id number]  HPI HTN- at last visit pt switched HCTZ to spironolactone due to thinning hair.  BP is well controlled.  Denies CP, SOB, HA's, visual changes, edema.  Obesity- pt is down 5 lbs since last visit.  BMI 31.3  Exercising regularly- cycles 3x/week, personal training 1x/week.  On [place], weight watchers.  Has limited alcohol to 1 glass on weekends.    Foot pain- pt reports bilateral foot pain, particularly after working out.  'feels like my arches are collapsing'.  Then she has to limp.   Review of Systems For ROS see HPI     Objective:   Physical Exam Vitals reviewed.  Constitutional:      General: She is not in acute distress.    Appearance: Normal appearance. She is well-developed. She is not ill-appearing.  HENT:     Head: Normocephalic and atraumatic.  Eyes:     Conjunctiva/sclera: Conjunctivae normal.     Pupils: Pupils are equal, round, and reactive to light.  Neck:     Thyroid: No thyromegaly.  Cardiovascular:     Rate and Rhythm: Normal rate and regular rhythm.     Pulses: Normal pulses.     Heart sounds: Normal heart sounds. No murmur heard. Pulmonary:     Effort: Pulmonary effort is normal. No respiratory distress.     Breath sounds: Normal breath sounds.  Abdominal:     General: There is no distension.     Palpations: Abdomen is soft.     Tenderness: There is no abdominal tenderness.  Musculoskeletal:     Cervical back: Normal range of motion and neck supple.     Right lower leg: No edema.     Left lower leg: No edema.  Lymphadenopathy:     Cervical: No cervical adenopathy.  Skin:    General: Skin is warm and dry.  Neurological:     General: No focal deficit present.     Mental Status: She is alert and oriented to person, place, and time.  Psychiatric:        Behavior: Behavior normal.           Assessment & Plan:  Bilateral foot pain- if pt does  any prolonged walking or standing she develops pain 'like my foot is collapsing' and then has to limp.  She would like a podiatry referral as this limits her physical activity.  Referral placed.

## 2022-04-21 NOTE — Assessment & Plan Note (Signed)
Chronic problem.  At last visit we switched HCTZ to Spironolactone.  BP is well controlled and she is asymptomatic.  Check labs due to K+ sparing diuretic but no anticipated med changes.

## 2022-04-21 NOTE — Telephone Encounter (Signed)
Left pt a VM stating lab results  

## 2022-05-25 ENCOUNTER — Telehealth: Payer: Self-pay | Admitting: Family Medicine

## 2022-05-25 ENCOUNTER — Other Ambulatory Visit: Payer: Self-pay | Admitting: Family Medicine

## 2022-05-25 DIAGNOSIS — F419 Anxiety disorder, unspecified: Secondary | ICD-10-CM

## 2022-05-25 MED ORDER — LORAZEPAM 0.5 MG PO TABS: 0.5000 mg | ORAL_TABLET | Freq: Two times a day (BID) | ORAL | 1 refills | Status: DC | PRN

## 2022-05-25 NOTE — Telephone Encounter (Signed)
Pt aware Rx has been sent in.  

## 2022-05-25 NOTE — Telephone Encounter (Signed)
Lorazepam 0.5 mg LOV: 04/21/22 Last Refill:02/04/22 Upcoming appt: none [facility] [address]

## 2022-05-25 NOTE — Telephone Encounter (Signed)
Lorazepam (ATIVAN) 0.5mG     Pt is requesting because she's going on vacation and her face swells (she's knows it has been discontinued Certirzine (ZYRTEC) 

## 2022-05-25 NOTE — Telephone Encounter (Signed)
Encourage patient to contact the pharmacy for refills or they can request refills through [facility]  (Please schedule appointment if patient has not been seen in over a year)    WHAT PHARMACY WOULD THEY LIKE THIS SENT TO:  [id number] [address] [place] of [address]   MEDICATION NAME & DOSE:  NOTES/COMMENTS FROM PATIENT:      Front office please notify patient: It takes 48-72 hours to process rx refill requests Ask patient to call pharmacy to ensure rx is ready before heading there.

## 2022-05-25 NOTE — Telephone Encounter (Signed)
Encourage patient to contact the pharmacy for refills or they can request refills through [facility]      WHAT PHARMACY WOULD THEY LIKE THIS SENT TO:    MEDICATION NAME & DOSE:   NOTES/COMMENTS FROM PATIENT:      Front office please notify patient: It takes 48-72 hours to process rx refill requests Ask patient to call pharmacy to ensure rx is ready before heading there.

## 2022-06-10 ENCOUNTER — Encounter: Payer: Self-pay | Admitting: Podiatry

## 2022-06-10 ENCOUNTER — Ambulatory Visit (INDEPENDENT_AMBULATORY_CARE_PROVIDER_SITE_OTHER): Payer: 59

## 2022-06-10 ENCOUNTER — Ambulatory Visit: Payer: 59 | Admitting: Podiatry

## 2022-06-10 DIAGNOSIS — M76822 Posterior tibial tendinitis, left leg: Secondary | ICD-10-CM

## 2022-06-10 DIAGNOSIS — M76821 Posterior tibial tendinitis, right leg: Secondary | ICD-10-CM

## 2022-06-10 DIAGNOSIS — M79671 Pain in right foot: Secondary | ICD-10-CM

## 2022-06-10 MED ORDER — MELOXICAM 15 MG PO TABS: 15.0000 mg | ORAL_TABLET | Freq: Every day | ORAL | 0 refills | Status: DC

## 2022-06-10 NOTE — Progress Notes (Signed)
  Subjective:  Patient ID: Angela Morris, female    DOB: [birth year],   MRN: [id number]  Chief Complaint  Patient presents with   Foot Pain     Bilateral arch pain    52 y.o. female presents for concern of bilateral foot pain that has been going on for a couple of years .Relates pain worsens with activity. Relates most of the pain is in the arch. Uses a golf ball to massage the area. Relates she has had foot injuries in the past on left .  Denies any other pedal complaints. Denies n/v/f/c.   Past Medical History:  Diagnosis Date   Anxiety    Depression    Hypertension    Shingles     Objective:  Physical Exam: Vascular: DP/PT pulses 2/4 bilateral. CFT <3 seconds. Normal hair growth on digits. No edema.  Skin. No lacerations or abrasions bilateral feet.  Musculoskeletal: MMT 5/5 bilateral lower extremities in DF, PF, Inversion and Eversion. Deceased ROM in DF of ankle joint. Tender to the insertion of the PT at the navicular. No pain more proximal. Pain with inversion. No pain with DF PF or eversion. Some pain to plantar heel but mostly around navicular.  Neurological: Sensation intact to light touch.   Assessment:   1. Posterior tibial tendon dysfunction (PTTD) of both lower extremities      Plan:  Patient was evaluated and treated and all questions answered. -Xrays reviewed. Moderate collapse of the midfoot arch consistent with pes planus bilateral. Mild spurring noted to inferior calcaneus bilateral. . Discussed PTTD diagnosis and treatment options with patient. Stretching exercises discussed and handout dispensed. Prescription for meloxicam provided. Tri-Lock ankle brace dispensed. Discussed if there is no improvement PT/MRI/injection may be an option. Patient to return to clinic in 6 to 8 weeks or sooner if symptoms fail to improve or worsen.   Louann Sjogren, DPM

## 2022-06-10 NOTE — Patient Instructions (Signed)
Posterior Tibial Tendinitis Rehab Ask your health care provider which exercises are safe for you. Do exercises exactly as told by your health care provider and adjust them as directed. It is normal to feel mild stretching, pulling, tightness, or discomfort as you do these exercises. Stop right away if you feel sudden pain or your pain gets worse. Do not begin these exercises until told by your health care provider. Stretching and range-of-motion exercises These exercises warm up your muscles and joints and improve the movement and flexibility in your ankle and foot. These exercises may also help to relieve pain. Standing wall calf stretch, knee straight  Stand with your hands against a wall. Extend your left / right leg behind you, and bend your front knee slightly. If directed, place a folded washcloth under the arch of your foot for support. Point the toes of your back foot slightly inward. Keeping your heels on the floor and your back knee straight, shift your weight toward the wall. Do not allow your back to arch. You should feel a gentle stretch in your upper left / right calf. Hold this position for __________ seconds. Repeat __________ times. Complete this exercise __________ times a day. Standing wall calf stretch, knee bent Stand with your hands against a wall. Extend your left / right leg behind you, and bend your front knee slightly. If directed, place a folded washcloth under the arch of your foot for support. Point the toes of your back foot slightly inward. Unlock your back knee so it is bent. Keep your heels on the floor. You should feel a gentle stretch deep in your lower left / right calf. Hold this position for __________ seconds. Repeat __________ times. Complete this exercise __________ times a day. Strengthening exercises These exercises build strength and endurance in your ankle and foot. Endurance is the ability to use your muscles for a long time, even after they get  tired. Ankle inversion with band Secure one end of a rubber exercise band or tubing to a fixed object, such as a table leg or a pole, that will stay still when the band is pulled. Loop the other end of the band around the middle of your left / right foot. Sit on the floor facing the object with your left / right leg extended. The band or tube should be slightly tense when your foot is relaxed. Leading with your big toe, slowly bring your left / right foot and ankle inward, toward your other foot (inversion). Hold this position for __________ seconds. Slowly return your foot to the starting position. Repeat __________ times. Complete this exercise __________ times a day. Towel curls  Sit in a chair on a non-carpeted surface, and put your feet on the floor. Place a towel in front of your feet. If told by your health care provider, add a __________ weight to the end of the towel. Keeping your heel on the floor, put your left / right foot on the towel. Pull the towel toward you by grabbing the towel with your toes and curling them under. Keep your heel on the floor while you do this. Let your toes relax. Grab the towel with your toes again. Keep going until the towel is completely underneath your foot. Repeat __________ times. Complete this exercise __________ times a day. Balance exercise This exercise improves or maintains your balance. Balance is important in preventing falls. Single leg stand Without wearing shoes, stand near a railing or in a doorway. You may hold   on to the railing or door frame as needed for balance. Stand on your left / right foot. Keep your big toe down on the floor and try to keep your arch lifted. If balancing in this position is too easy, try the exercise with your eyes closed or while standing on a pillow. Hold this position for __________ seconds. Repeat __________ times. Complete this exercise __________ times a day. This information is not intended to replace  advice given to you by your health care provider. Make sure you discuss any questions you have with your health care provider. Document Revised: 05/15/2018 Document Reviewed: 03/21/2018 Elsevier Patient Education  2023 Elsevier Inc.  

## 2022-06-28 ENCOUNTER — Telehealth: Payer: Self-pay | Admitting: Family Medicine

## 2022-06-28 NOTE — Telephone Encounter (Signed)
I have spoke to the pt and advised her that she will need to contact her insurance to see if they cover any weight loss medications

## 2022-06-28 NOTE — Telephone Encounter (Signed)
Patient called and would like a call back regarding weight loss options that was discussed prior.

## 2022-06-29 ENCOUNTER — Other Ambulatory Visit: Payer: Self-pay | Admitting: Family Medicine

## 2022-06-29 ENCOUNTER — Telehealth: Payer: Self-pay

## 2022-06-29 MED ORDER — ZEPBOUND 2.5 MG/0.5ML ~~LOC~~ SOAJ: 2.5000 mg | SUBCUTANEOUS | 0 refills | Status: DC

## 2022-06-29 NOTE — Telephone Encounter (Signed)
Advise

## 2022-06-29 NOTE — Telephone Encounter (Signed)
Patient has been called and is aware the medication will need to be sent in for the PA process to begin.   I have updated her pharmacies, she would like it sent to the [facility] on [address]

## 2022-06-29 NOTE — Telephone Encounter (Signed)
Prescription sent to Pharmacy to initiate prior authorization.  She needs to understand that this can take several days and she will be updated when we know anything

## 2022-06-29 NOTE — Telephone Encounter (Signed)
Pharmacy Patient Advocate Encounter   Received notification from [facility] that prior authorization for Zepbound 2.5MG /0.5ML pen-injectors is required/requested.   PA submitted on 06/29/22 to (ins) [facility] via CoverMyMeds Key or (Medicaid) confirmation # [id number] Status is pending

## 2022-06-29 NOTE — Telephone Encounter (Signed)
PA submitted via CMM. Created new encounter for PA. Will route back to pool once determination has been made. 

## 2022-06-29 NOTE — Telephone Encounter (Signed)
Attempted to call pt number busy . Message sent to pharmacy team to start PA

## 2022-06-29 NOTE — Telephone Encounter (Signed)
Pt called and just checking up on PA.

## 2022-06-29 NOTE — Telephone Encounter (Signed)
Can you start a PA for this pt's Zepbound please ? She is a new start and DX HTN , Obesity

## 2022-06-30 ENCOUNTER — Other Ambulatory Visit: Payer: Self-pay

## 2022-06-30 MED ORDER — ZEPBOUND 2.5 MG/0.5ML ~~LOC~~ SOAJ: 2.5000 mg | SUBCUTANEOUS | 1 refills | Status: DC

## 2022-06-30 NOTE — Telephone Encounter (Signed)
Patient is requesting a refill of the following medications: Requested Prescriptions   Pending Prescriptions Disp Refills   tirzepatide (ZEPBOUND) 2.5 MG/0.5ML Pen 2 mL 0    Sig: Inject 2.5 mg into the skin once a week.   Signed Prescriptions Disp Refills   tirzepatide (ZEPBOUND) 2.5 MG/0.5ML Pen 2 mL 0    Sig: Inject 2.5 mg into the skin once a week.    Authorizing Provider: Sheliah Hatch    Date of patient request: 06/30/2022 Last office visit: 04/21/2022 Date of last refill: 06/29/2022 Last refill amount: 2mL Follow up time period per chart: 6 months

## 2022-06-30 NOTE — Telephone Encounter (Signed)
Patient Advocate Encounter  Prior Authorization for Zepbound 2.5MG /0.5ML pen-injectors has been approved with [employer].    PA#  [id number] Effective dates: 06/29/22 through 03/01/23

## 2022-06-30 NOTE — Addendum Note (Signed)
Addended by: Eldred Manges on: 06/30/2022 10:23 AM   Modules accepted: Orders

## 2022-06-30 NOTE — Telephone Encounter (Signed)
I have advised pt that the Zebpound was approved and to contact the pharmacy

## 2022-06-30 NOTE — Telephone Encounter (Signed)
Rx sent to [facility] [address] pt did not clarify her pharmacy has changed when she requested the medication

## 2022-06-30 NOTE — Telephone Encounter (Signed)
Patient called and would RX zepbound sent to [facility] on [address] in [place]

## 2022-07-21 ENCOUNTER — Telehealth: Payer: Self-pay | Admitting: Family Medicine

## 2022-07-21 MED ORDER — ZEPBOUND 5 MG/0.5ML ~~LOC~~ SOAJ: 5.0000 mg | SUBCUTANEOUS | 1 refills | Status: DC

## 2022-07-21 NOTE — Telephone Encounter (Signed)
Medication refill:   Medication: tirzepatide (ZEPBOUND) 2.5 MG/0.5ML Pen   Patient would like to increase to 5mg  says it has been a month  Pharmacy:  [facility] [id number] - [place], [place] - [address]    Last Visit: 3.21.24  Next Visit: n/a

## 2022-07-21 NOTE — Telephone Encounter (Signed)
Is this ok?

## 2022-07-21 NOTE — Telephone Encounter (Signed)
New prescription for 5mg  dose sent to [facility]

## 2022-07-29 ENCOUNTER — Ambulatory Visit: Payer: 59 | Admitting: Podiatry

## 2022-09-14 ENCOUNTER — Telehealth: Payer: Self-pay | Admitting: Family Medicine

## 2022-09-14 ENCOUNTER — Other Ambulatory Visit: Payer: Self-pay

## 2022-09-14 MED ORDER — ZEPBOUND 5 MG/0.5ML ~~LOC~~ SOAJ: 5.0000 mg | SUBCUTANEOUS | 1 refills | Status: DC

## 2022-09-14 NOTE — Telephone Encounter (Signed)
Zepbound has been sent to pharmacy

## 2022-09-14 NOTE — Telephone Encounter (Signed)
Encourage patient to contact the pharmacy for refills or they can request refills through [facility]  (Please schedule appointment if patient has not been seen in over a year)    WHAT PHARMACY WOULD THEY LIKE THIS SENT TO: [facility] [id number] - [place], [place] - [address]   MEDICATION NAME & DOSE: tirzepatide (ZEPBOUND) 5 MG/0.5ML Pen   NOTES/COMMENTS FROM PATIENT: Pt says she is doing well and is comfortable with the 5mg      Front office please notify patient: It takes 48-72 hours to process rx refill requests Ask patient to call pharmacy to ensure rx is ready before heading there.

## 2022-09-26 ENCOUNTER — Other Ambulatory Visit: Payer: Self-pay

## 2022-09-26 MED ORDER — SPIRONOLACTONE 25 MG PO TABS: 25.0000 mg | ORAL_TABLET | Freq: Every day | ORAL | 3 refills | Status: DC

## 2022-10-17 ENCOUNTER — Telehealth: Payer: Self-pay | Admitting: Family Medicine

## 2022-10-17 MED ORDER — ZEPBOUND 5 MG/0.5ML ~~LOC~~ SOAJ: 5.0000 mg | SUBCUTANEOUS | 1 refills | Status: DC

## 2022-10-17 MED ORDER — ZEPBOUND 7.5 MG/0.5ML ~~LOC~~ SOAJ: 7.5000 mg | SUBCUTANEOUS | 1 refills | Status: DC

## 2022-10-17 NOTE — Telephone Encounter (Signed)
LM to inform the patient

## 2022-10-17 NOTE — Telephone Encounter (Signed)
Pt wants to know if she can go up to the 7.5mg ?

## 2022-10-17 NOTE — Telephone Encounter (Signed)
I sent a prescription for the next higher dose (7.5mg  weekly) to local pharmacy.

## 2022-10-17 NOTE — Telephone Encounter (Signed)
Sent!

## 2022-10-17 NOTE — Telephone Encounter (Signed)
Encourage patient to contact the pharmacy for refills or they can request refills through [facility]   WHAT PHARMACY WOULD THEY LIKE THIS SENT TO:  [id number] - [place], [place] - [address]   MEDICATION NAME & DOSE: tirzepatide (ZEPBOUND) 7.5 MG/0.5ML Pen  NOTES/COMMENTS FROM PATIENT:      Front office please notify patient: It takes 48-72 hours to process rx refill requests Ask patient to call pharmacy to ensure rx is ready before heading there.

## 2022-11-07 ENCOUNTER — Encounter: Payer: Self-pay | Admitting: Family Medicine

## 2022-11-07 ENCOUNTER — Ambulatory Visit (INDEPENDENT_AMBULATORY_CARE_PROVIDER_SITE_OTHER): Payer: 59 | Admitting: Family Medicine

## 2022-11-07 VITALS — BP 152/102 | HR 92 | Temp 98.0°F | Ht 63.5 in | Wt 161.2 lb

## 2022-11-07 DIAGNOSIS — I10 Essential (primary) hypertension: Secondary | ICD-10-CM

## 2022-11-07 DIAGNOSIS — Z0000 Encounter for general adult medical examination without abnormal findings: Secondary | ICD-10-CM | POA: Diagnosis not present

## 2022-11-07 DIAGNOSIS — E559 Vitamin D deficiency, unspecified: Secondary | ICD-10-CM

## 2022-11-07 LAB — CBC WITH DIFFERENTIAL/PLATELET
Basophils Absolute: 0.1 10*3/uL (ref 0.0–0.1)
Basophils Relative: 1.3 % (ref 0.0–3.0)
Eosinophils Absolute: 0.2 10*3/uL (ref 0.0–0.7)
Eosinophils Relative: 2.6 % (ref 0.0–5.0)
HCT: 40.4 % (ref 36.0–46.0)
Hemoglobin: 12.7 g/dL (ref 12.0–15.0)
Lymphocytes Relative: 27.8 % (ref 12.0–46.0)
Lymphs Abs: 1.7 10*3/uL (ref 0.7–4.0)
MCHC: 31.4 g/dL (ref 30.0–36.0)
MCV: 86.2 fL (ref 78.0–100.0)
Monocytes Absolute: 0.6 10*3/uL (ref 0.1–1.0)
Monocytes Relative: 8.9 % (ref 3.0–12.0)
Neutro Abs: 3.7 10*3/uL (ref 1.4–7.7)
Neutrophils Relative %: 59.4 % (ref 43.0–77.0)
Platelets: 302 10*3/uL (ref 150.0–400.0)
RBC: 4.68 Mil/uL (ref 3.87–5.11)
RDW: 14.8 % (ref 11.5–15.5)
WBC: 6.2 10*3/uL (ref 4.0–10.5)

## 2022-11-07 NOTE — Assessment & Plan Note (Signed)
Check labs and replete prn. 

## 2022-11-07 NOTE — Progress Notes (Signed)
   Subjective:    Patient ID: Angela Morris, female    DOB: [birth year], 52 y.o.   MRN: [id number]  HPI CPE- UTD on pap, mammo, Tdap, cologuard.  Patient Care Team    Relationship Specialty Notifications Start End  Sheliah Hatch, MD PCP - General   01/13/10   Hal Morales, MD (Inactive) Consulting Physician Obstetrics and Gynecology  11/29/16     Health Maintenance  Topic Date Due   HIV Screening  Never done   Zoster Vaccines- Shingrix (1 of 2) Never done   COVID-19 Vaccine (1 - 2023-24 season) 11/23/2022 (Originally 10/02/2022)   INFLUENZA VACCINE  05/01/2023 (Originally 09/01/2022)   Cervical Cancer Screening (HPV/Pap Cotest)  11/28/2023   MAMMOGRAM  01/04/2024   DTaP/Tdap/Td (3 - Td or Tdap) 09/03/2024   Fecal DNA (Cologuard)  11/29/2024   HPV VACCINES  Aged Out   Hepatitis C Screening  Discontinued     Review of Systems Patient reports no vision/ hearing changes, adenopathy,fever,  persistant/recurrent hoarseness , swallowing issues, chest pain, palpitations, edema, persistant/recurrent cough, hemoptysis, dyspnea (rest/exertional/paroxysmal nocturnal), gastrointestinal bleeding (melena, rectal bleeding), abdominal pain, significant heartburn, bowel changes, GU symptoms (dysuria, hematuria, incontinence), Gyn symptoms (abnormal  bleeding, pain),  syncope, focal weakness, memory loss, numbness & tingling, skin/hair/nail changes, abnormal bruising or bleeding, anxiety, or depression.   + 20 lb weight loss    Objective:   Physical Exam General Appearance:    Alert, cooperative, no distress, appears stated age  Head:    Normocephalic, without obvious abnormality, atraumatic  Eyes:    PERRL, conjunctiva/corneas clear, EOM's intact both eyes  Ears:    Normal TM's and external ear canals, both ears  Nose:   Nares normal, septum midline, mucosa normal, no drainage    or sinus tenderness  Throat:   Lips, mucosa, and tongue normal; teeth and gums normal  Neck:    Supple, symmetrical, trachea midline, no adenopathy;    Thyroid: no enlargement/tenderness/nodules  Back:     Symmetric, no curvature, ROM normal, no CVA tenderness  Lungs:     Clear to auscultation bilaterally, respirations unlabored  Chest Wall:    No tenderness or deformity   Heart:    Regular rate and rhythm, S1 and S2 normal, no murmur, rub   or gallop  Breast Exam:    Deferred to GYN  Abdomen:     Soft, non-tender, bowel sounds active all four quadrants,    no masses, no organomegaly  Genitalia:    Deferred to GYN  Rectal:    Extremities:   Extremities normal, atraumatic, no cyanosis or edema  Pulses:   2+ and symmetric all extremities  Skin:   Skin color, texture, turgor normal, no rashes or lesions  Lymph nodes:   Cervical, supraclavicular, and axillary nodes normal  Neurologic:   CNII-XII intact, normal strength, sensation and reflexes    throughout          Assessment & Plan:

## 2022-11-07 NOTE — Patient Instructions (Signed)
Follow up in 3-4 weeks to recheck blood pressure We'll notify you of your lab results and make any changes if needed Try and limit your salt intake to lower your blood pressure Increase your water intake Try and work on stress management Call with any questions or concerns Stay Safe!  Stay Healthy! Happy Fall!!

## 2022-11-07 NOTE — Assessment & Plan Note (Signed)
Pt's PE WNL w/ exception of BP.  UTD on pap, mammo, cologuard, Tdap.  Down 20 lbs since last visit!  Applauded her efforts.  Check labs.  Anticipatory guidance provided.

## 2022-11-07 NOTE — Assessment & Plan Note (Signed)
Deteriorated.  BP is elevated today but pt reports she is under increased stress recently.  Will hold on medication adjustments but have her back in 3-4 weeks to recheck.  If still high, will add meds at that time.  Pt expressed understanding and is in agreement w/ plan.

## 2022-11-08 LAB — BASIC METABOLIC PANEL
BUN: 13 mg/dL (ref 6–23)
CO2: 27 meq/L (ref 19–32)
Calcium: 10.3 mg/dL (ref 8.4–10.5)
Chloride: 102 meq/L (ref 96–112)
Creatinine, Ser: 0.66 mg/dL (ref 0.40–1.20)
GFR: 100.83 mL/min (ref >60.00)
Glucose, Bld: 83 mg/dL (ref 70–99)
Potassium: 4.5 meq/L (ref 3.5–5.1)
Sodium: 139 meq/L (ref 135–145)

## 2022-11-08 LAB — VITAMIN D 25 HYDROXY (VIT D DEFICIENCY, FRACTURES): VITD: 20.56 ng/mL — ABNORMAL LOW (ref 30.00–100.00)

## 2022-11-08 LAB — LIPID PANEL
Cholesterol: 237 mg/dL — ABNORMAL HIGH (ref 0–200)
HDL: 123.5 mg/dL (ref >39.00)
LDL Cholesterol: 103 mg/dL — ABNORMAL HIGH (ref 0–99)
NonHDL: 113.06
Total CHOL/HDL Ratio: 2
Triglycerides: 51 mg/dL (ref 0.0–149.0)
VLDL: 10.2 mg/dL (ref 0.0–40.0)

## 2022-11-08 LAB — HEPATIC FUNCTION PANEL
ALT: 20 U/L (ref 0–35)
AST: 23 U/L (ref 0–37)
Albumin: 4.7 g/dL (ref 3.5–5.2)
Alkaline Phosphatase: 54 U/L (ref 39–117)
Bilirubin, Direct: 0.2 mg/dL (ref 0.0–0.3)
Total Bilirubin: 1 mg/dL (ref 0.2–1.2)
Total Protein: 7.6 g/dL (ref 6.0–8.3)

## 2022-11-08 LAB — TSH: TSH: 2.29 u[IU]/mL (ref 0.35–5.50)

## 2022-11-09 ENCOUNTER — Encounter: Payer: Self-pay | Admitting: Family Medicine

## 2022-11-09 MED ORDER — VITAMIN D (ERGOCALCIFEROL) 1.25 MG (50000 UNIT) PO CAPS: 50000.0000 [IU] | ORAL_CAPSULE | ORAL | 0 refills | Status: DC

## 2022-11-09 NOTE — Telephone Encounter (Signed)
Pt is concerned about elevated cholesterol please advise

## 2022-11-16 ENCOUNTER — Other Ambulatory Visit: Payer: Self-pay | Admitting: Family Medicine

## 2022-11-16 ENCOUNTER — Other Ambulatory Visit: Payer: Self-pay

## 2022-11-16 MED ORDER — ZEPBOUND 7.5 MG/0.5ML ~~LOC~~ SOAJ: 7.5000 mg | SUBCUTANEOUS | 1 refills | Status: DC

## 2022-11-30 ENCOUNTER — Ambulatory Visit: Payer: 59 | Admitting: Family Medicine

## 2022-11-30 ENCOUNTER — Encounter: Payer: Self-pay | Admitting: Family Medicine

## 2022-11-30 VITALS — BP 122/84 | HR 89 | Temp 98.0°F | Ht 63.5 in | Wt 158.1 lb

## 2022-11-30 DIAGNOSIS — I10 Essential (primary) hypertension: Secondary | ICD-10-CM

## 2022-11-30 DIAGNOSIS — M545 Low back pain, unspecified: Secondary | ICD-10-CM

## 2022-11-30 LAB — POCT URINALYSIS DIPSTICK
Bilirubin, UA: NEGATIVE
Blood, UA: NEGATIVE
Glucose, UA: NEGATIVE
Ketones, UA: NEGATIVE
Nitrite, UA: NEGATIVE
Protein, UA: NEGATIVE
Spec Grav, UA: 1.015 (ref 1.010–1.025)
Urobilinogen, UA: 0.2 U/dL
pH, UA: 7 (ref 5.0–8.0)

## 2022-11-30 MED ORDER — NAPROXEN 500 MG PO TABS: 500.0000 mg | ORAL_TABLET | Freq: Two times a day (BID) | ORAL | 0 refills | Status: DC

## 2022-11-30 NOTE — Assessment & Plan Note (Signed)
Chronic problem.  BP is well controlled today on Spironolactone.  No need for additional meds as BP is back in normal range.

## 2022-11-30 NOTE — Patient Instructions (Signed)
Follow up in April to recheck BP START the Naproxen twice daily- w/ food- for 7-10 days and then as needed IF you find you need a muscle relaxer, let me know and I can send one in HEAT your lower back You can continues Tylenol but hold any ibuprofen, aleve, motrin, etc Call with any questions or concerns Stay Safe!  Stay Healthy! Feel Better Soon!!
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
## 2022-12-01 LAB — URINE CULTURE
MICRO NUMBER:: 15665591
Result:: NO GROWTH
SPECIMEN QUALITY:: ADEQUATE

## 2022-12-02 ENCOUNTER — Telehealth: Payer: Self-pay

## 2022-12-02 MED ORDER — METHOCARBAMOL 500 MG PO TABS: 500.0000 mg | ORAL_TABLET | Freq: Three times a day (TID) | ORAL | 0 refills | Status: AC | PRN

## 2022-12-02 NOTE — Telephone Encounter (Signed)
-----   Message from Neena Rhymes sent at 12/02/2022  7:30 AM EDT ----- No evidence of UTI- great news!

## 2022-12-02 NOTE — Addendum Note (Signed)
Addended by: Sheliah Hatch on: 12/02/2022 09:37 AM   Modules accepted: Orders

## 2022-12-02 NOTE — Telephone Encounter (Signed)
Pt has been notified.

## 2022-12-02 NOTE — Telephone Encounter (Signed)
Pt called back and was in formed

## 2022-12-02 NOTE — Telephone Encounter (Signed)
Called back to let her know Rx has been sent

## 2022-12-02 NOTE — Telephone Encounter (Signed)
Prescription for Methocarbamol sent to pharmacy

## 2022-12-05 ENCOUNTER — Ambulatory Visit: Payer: 59 | Admitting: Family Medicine

## 2022-12-22 ENCOUNTER — Encounter: Payer: Self-pay | Admitting: Family Medicine

## 2022-12-22 DIAGNOSIS — H61113 Acquired deformity of pinna, bilateral: Secondary | ICD-10-CM

## 2022-12-22 NOTE — Addendum Note (Signed)
Addended by: Sheliah Hatch on: 12/22/2022 03:31 PM   Modules accepted: Orders

## 2022-12-27 ENCOUNTER — Other Ambulatory Visit: Payer: Self-pay | Admitting: Family Medicine

## 2022-12-27 NOTE — Telephone Encounter (Signed)
Requested Prescriptions   Pending Prescriptions Disp Refills   naproxen (NAPROSYN) 500 MG tablet [Pharmacy Med Name: NAPROXEN 500MG  TABLETS] 60 tablet 0    Sig: TAKE 1 TABLET(500 MG) BY MOUTH TWICE DAILY WITH A MEAL     Date of patient request: 12/27/22 11/30/2022 05/01/2023 Date of last refill: 11/30/2022 Last refill amount: 60

## 2023-01-09 ENCOUNTER — Ambulatory Visit (INDEPENDENT_AMBULATORY_CARE_PROVIDER_SITE_OTHER): Payer: Self-pay | Admitting: Plastic Surgery

## 2023-01-09 ENCOUNTER — Encounter (HOSPITAL_BASED_OUTPATIENT_CLINIC_OR_DEPARTMENT_OTHER): Payer: Self-pay

## 2023-01-09 ENCOUNTER — Emergency Department (HOSPITAL_BASED_OUTPATIENT_CLINIC_OR_DEPARTMENT_OTHER)
Admission: EM | Admit: 2023-01-09 | Discharge: 2023-01-09 | Disposition: A | Discharge status: Home / Self Care | Payer: 59 | Attending: Emergency Medicine | Admitting: Emergency Medicine

## 2023-01-09 ENCOUNTER — Other Ambulatory Visit: Payer: Self-pay

## 2023-01-09 ENCOUNTER — Encounter: Payer: Self-pay | Admitting: Plastic Surgery

## 2023-01-09 VITALS — BP 173/113 | HR 94 | Ht 64.0 in | Wt 162.2 lb

## 2023-01-09 DIAGNOSIS — Z8673 Personal history of transient ischemic attack (TIA), and cerebral infarction without residual deficits: Secondary | ICD-10-CM | POA: Diagnosis not present

## 2023-01-09 DIAGNOSIS — I10 Essential (primary) hypertension: Secondary | ICD-10-CM

## 2023-01-09 DIAGNOSIS — Q178 Other specified congenital malformations of ear: Secondary | ICD-10-CM

## 2023-01-09 DIAGNOSIS — Z79899 Other long term (current) drug therapy: Secondary | ICD-10-CM | POA: Diagnosis not present

## 2023-01-09 DIAGNOSIS — R000 Tachycardia, unspecified: Secondary | ICD-10-CM | POA: Diagnosis not present

## 2023-01-09 LAB — CBC
HCT: 40 % (ref 36.0–46.0)
Hemoglobin: 12.7 g/dL (ref 12.0–15.0)
MCH: 27.1 pg (ref 26.0–34.0)
MCHC: 31.8 g/dL (ref 30.0–36.0)
MCV: 85.3 fL (ref 80.0–100.0)
Platelets: 313 10*3/uL (ref 150–400)
RBC: 4.69 MIL/uL (ref 3.87–5.11)
RDW: 13.9 % (ref 11.5–15.5)
WBC: 6.7 10*3/uL (ref 4.0–10.5)
nRBC: 0 % (ref 0.0–0.2)

## 2023-01-09 LAB — BASIC METABOLIC PANEL
Anion gap: 11 (ref 5–15)
BUN: 13 mg/dL (ref 6–20)
CO2: 23 mmol/L (ref 22–32)
Calcium: 9.8 mg/dL (ref 8.9–10.3)
Chloride: 103 mmol/L (ref 98–111)
Creatinine, Ser: 0.79 mg/dL (ref 0.44–1.00)
GFR, Estimated: >60 mL/min (ref >60)
Glucose, Bld: 97 mg/dL (ref 70–99)
Potassium: 3.5 mmol/L (ref 3.5–5.1)
Sodium: 137 mmol/L (ref 135–145)

## 2023-01-09 LAB — TROPONIN I (HIGH SENSITIVITY): Troponin I (High Sensitivity): 5 ng/L (ref <18)

## 2023-01-09 MED ORDER — AMLODIPINE BESYLATE 5 MG PO TABS
5.0000 mg | ORAL_TABLET | Freq: Once | ORAL | Status: DC
  Filled 2023-01-09: qty 1

## 2023-01-09 MED ORDER — AMLODIPINE BESYLATE 5 MG PO TABS: 5.0000 mg | ORAL_TABLET | Freq: Every day | ORAL | 1 refills | Status: DC

## 2023-01-09 NOTE — ED Provider Notes (Signed)
Angela Morris Provider Note   CSN: [id number] Arrival date & time: 01/09/23  1659     History  Chief Complaint  Patient presents with  . Hypertension    Angela Morris is a 52 y.o. female.   Hypertension  52 year old female history of hypertension presenting for hypertension.  She had a routine appointment with her plastic surgeon today for follow-up.  They noted her blood pressure was anywhere between 160s and 190s there.  She is asymptomatic.  Usually pressures in the 120s to 130s.  She takes spironolactone which she took today for blood pressure nothing else.  She denied any chest pain or shortness of breath.  No headache or vision changes or weakness or numbness.  Her blood pressure was high and she noted some paresthesia/pins and needle feeling in her left hand but this resolved quickly.  Currently is asymptomatic and feels well.  She otherwise been doing well.  No other concerns.     Home Medications Prior to Admission medications   Medication Sig Start Date End Date Taking? Authorizing Provider  amLODipine (NORVASC) 5 MG tablet Take 1 tablet (5 mg total) by mouth daily. 01/09/23  Yes Laurence Spates, MD  LORazepam (ATIVAN) 0.5 MG tablet TAKE HALF TO ONE TABLET BY MOUTH AS NEEDED FOR ANXIETY 03/04/21   Sheliah Hatch, MD  methocarbamol (ROBAXIN) 500 MG tablet Take 1 tablet (500 mg total) by mouth every 8 (eight) hours as needed for muscle spasms. 12/02/22   Sheliah Hatch, MD  naproxen (NAPROSYN) 500 MG tablet TAKE 1 TABLET(500 MG) BY MOUTH TWICE DAILY WITH A MEAL 12/27/22   Sheliah Hatch, MD  spironolactone (ALDACTONE) 25 MG tablet Take 1 tablet (25 mg total) by mouth daily. 09/26/22   Sheliah Hatch, MD  tirzepatide (ZEPBOUND) 7.5 MG/0.5ML Pen Inject 7.5 mg into the skin once a week. 11/16/22   Shade Flood, MD  Vitamin D, Ergocalciferol, (DRISDOL) 1.25 MG (50000 UNIT) CAPS capsule Take 1 capsule  (50,000 Units total) by mouth every 7 (seven) days. 11/08/21   Sheliah Hatch, MD  Vitamin D, Ergocalciferol, (DRISDOL) 1.25 MG (50000 UNIT) CAPS capsule Take 1 capsule (50,000 Units total) by mouth every 7 (seven) days. Discard previous RX quantity was incorrect Patient not taking: Reported on 11/30/2022 11/09/22   Sheliah Hatch, MD      Allergies    Penicillins and Sulfonamide derivatives    Review of Systems   Review of Systems Review of systems completed and notable as per HPI.  ROS otherwise negative.   Physical Exam Updated Vital Signs BP (!) 197/121 (BP Location: Left Arm)   Pulse 88   Temp 98 F (36.7 C) (Oral)   Resp 18   Ht 5\' 4"  (1.626 m)   Wt 74.8 kg   LMP 02/15/2019   SpO2 100%   BMI 28.32 kg/m  Physical Exam Vitals and nursing note reviewed.  Constitutional:      General: She is not in acute distress.    Appearance: She is well-developed.  HENT:     Head: Normocephalic and atraumatic.  Eyes:     Conjunctiva/sclera: Conjunctivae normal.  Cardiovascular:     Rate and Rhythm: Normal rate and regular rhythm.     Heart sounds: No murmur heard. Pulmonary:     Effort: Pulmonary effort is normal. No respiratory distress.     Breath sounds: Normal breath sounds.  Abdominal:     Palpations: Abdomen  is soft.     Tenderness: There is no abdominal tenderness.  Musculoskeletal:        General: No swelling.     Cervical back: Neck supple.     Right lower leg: No edema.     Left lower leg: No edema.  Skin:    General: Skin is warm and dry.     Capillary Refill: Capillary refill takes less than 2 seconds.  Neurological:     General: No focal deficit present.     Mental Status: She is alert and oriented to person, place, and time. Mental status is at baseline.     Cranial Nerves: No cranial nerve deficit.     Sensory: No sensory deficit.     Motor: No weakness.     Coordination: Coordination normal.     Gait: Gait normal.     Comments: Awake and alert.   Cranial nerves intact.  No visual field cuts.  Normal finger-to-nose bilaterally.  Normal gait.  Normal strength and sensation all extremities.  Normal reflexes.  Normal speech.  Psychiatric:        Mood and Affect: Mood normal.     ED Results / Procedures / Treatments   Labs (all labs ordered are listed, but only abnormal results are displayed) Labs Reviewed  BASIC METABOLIC PANEL  CBC  TROPONIN I (HIGH SENSITIVITY)    EKG None  Radiology No results found.  Procedures Procedures    Medications Ordered in ED Medications  amLODipine (NORVASC) tablet 5 mg (has no administration in time range)    ED Course/ Medical Decision Making/ A&P Clinical Course as of 01/09/23 2044  Mon Jan 09, 2023  2044 Patient blood pressure is normal.  She states she is actually not sure if she took her medicine yesterday.  Wants to hold off on amlodipine [JD]    Clinical Course User Index [JD] Laurence Spates, MD                                 Medical Decision Making Amount and/or Complexity of Data Reviewed Labs: ordered.  Risk Prescription drug management.   Medical Decision Making:   Angela Morris is a 52 y.o. female who presented to the ED today with asymptomatic hypertension.  She is hypertensive here on my evaluation she is 160/100.  She has had no chest pain or shortness of breath.  EKG shows sinus tachycardia no ischemia and troponin is normal.  She had some paresthesias in the left hand earlier but this was in the setting of multiple blood pressure measurements I wonder if it was related to this.  She is completely normal neurology exam I low suspicion for stroke, TIA, or hypertensive emergency.  She is asymptomatic currently and feels well.  Blood pressure has improved on its own.  She is on spironolactone, will start her on amlodipine to help with her blood pressure and have her follow close with her PCP.  Strict return precautions given.   Patient placed on  continuous vitals and telemetry monitoring while in ED which was reviewed periodically.  Reviewed and confirmed nursing documentation for past medical history, family history, social history.  Patient's presentation is most consistent with acute complicated illness / injury requiring diagnostic workup.         Final Clinical Impression(s) / ED Diagnoses Final diagnoses:  Primary hypertension    Rx / DC Orders ED Discharge Orders  Ordered    amLODipine (NORVASC) 5 MG tablet  Daily        01/09/23 1950              Laurence Spates, MD 01/09/23 1950

## 2023-01-09 NOTE — Discharge Instructions (Signed)
You were seen today for high blood pressure.  You are being started on a new medication.  Your blood work is reassuring.  If you develop chest pain, difficulty breathing, uncontrolled blood pressure or severe headache or vision changes or weakness or numbness you should return to the ED.  You should follow-up with your doctor later this week.

## 2023-01-09 NOTE — ED Triage Notes (Signed)
Pt arrives with c/o HTN. Pt sent here from plastic surgeons office due to elevated BP. Pt takes a diuretic for BP management. Pt denies CP or SOB. Pt reports tingling sensation in left hand. Pt denies focal weakness, headache, or dizziness.

## 2023-01-09 NOTE — ED Notes (Signed)
Pt wishes to not take amlodipine at this time since BP is WNL. Dr Earlene Plater aware and at bedside discussing treatment/ management with discharge.

## 2023-01-09 NOTE — Progress Notes (Signed)
Referring Provider Angela Hatch, MD [address] [place],  [place] [address]   CC:  Chief Complaint  Patient presents with   Consult           Angela Morris is an 52 y.o. female.  HPI: Angela Morris is a 52 year old female who presents with 2 defects in the left earlobe where her earrings pulled through.  This is a complete pull-through with reepithelialization of the skin edges.  She is interested in having the defects repaired  Allergies  Allergen Reactions   Penicillins Hives    Did it involve swelling of the face/tongue/throat, SOB, or low BP? No Did it involve sudden or severe rash/hives, skin peeling, or any reaction on the inside of your mouth or nose? No Did you need to seek medical attention at a hospital or doctor's office? No When did it last happen?      20's If all above answers are "NO", may proceed with cephalosporin use.    Sulfonamide Derivatives Shortness Of Breath    Outpatient Encounter Medications as of 01/09/2023  Medication Sig   LORazepam (ATIVAN) 0.5 MG tablet TAKE HALF TO ONE TABLET BY MOUTH AS NEEDED FOR ANXIETY   methocarbamol (ROBAXIN) 500 MG tablet Take 1 tablet (500 mg total) by mouth every 8 (eight) hours as needed for muscle spasms.   naproxen (NAPROSYN) 500 MG tablet TAKE 1 TABLET(500 MG) BY MOUTH TWICE DAILY WITH A MEAL   spironolactone (ALDACTONE) 25 MG tablet Take 1 tablet (25 mg total) by mouth daily.   tirzepatide (ZEPBOUND) 7.5 MG/0.5ML Pen Inject 7.5 mg into the skin once a week.   Vitamin D, Ergocalciferol, (DRISDOL) 1.25 MG (50000 UNIT) CAPS capsule Take 1 capsule (50,000 Units total) by mouth every 7 (seven) days.   Vitamin D, Ergocalciferol, (DRISDOL) 1.25 MG (50000 UNIT) CAPS capsule Take 1 capsule (50,000 Units total) by mouth every 7 (seven) days. Discard previous RX quantity was incorrect (Patient not taking: Reported on 11/30/2022)   No facility-administered encounter medications on file as of 01/09/2023.      Past Medical History:  Diagnosis Date   Anxiety    Depression    Hypertension    Shingles     Past Surgical History:  Procedure Laterality Date   CYSTOSCOPY Bilateral 03/05/2019   Procedure: CYSTOSCOPY;  Surgeon: Angela Rosenthal, MD;  Location[facility] [place];  Service: Gynecology;  Laterality: Bilateral;   ROBOTIC ASSISTED LAPAROSCOPIC HYSTERECTOMY AND SALPINGECTOMY Bilateral 03/05/2019   Procedure: XI ROBOTIC ASSISTED LAPAROSCOPIC HYSTERECTOMY AND SALPINGECTOMY;  Surgeon: Angela Rosenthal, MD;  Location: [facility];  Service: Gynecology;  Laterality: Bilateral;   TUBAL LIGATION  2002    Family History  Problem Relation Age of Onset   Hypertension Mother    Hypertension Father    Stroke Father    Coronary artery disease Other        female relative <50   Breast cancer Neg Hx     Social History   Social History Narrative   Troubled marriage--husband refuses counseling     Review of Systems General: Denies fevers, chills, weight loss CV: Denies chest pain, shortness of breath, palpitations Skin: Earlobe defects x 2.  No pain, no infection  Physical Exam    01/09/2023    2:43 PM 11/30/2022    2:18 PM 11/07/2022    2:14 PM  Vitals with BMI  Height 5\' 4"  5' 3.5"   Weight 162 lbs 3 oz 158 lbs  2 oz   BMI 27.83 27.57   Systolic 173 122 782  Diastolic 113 84 102  Pulse 94 89     General:  No acute distress,  Alert and oriented, Non-Toxic, Normal speech and affect Integument: 2 defects in the left earlobe approximately 1 cm apart.  Both defects are completely reepithelialized. Mammogram: Not applicable Assessment/Plan Acquired defect left earlobe: Discussed repair with the patient including the technique and the use of sutures.  We discussed the we portance of not repierce in the ears until she is completely healed which will be several months.  It would be best if she did not repierce the same areas at all.  She understands this  and wishes to proceed.  Will give her a quote for the repair and schedule at her request.  Photographs were obtained today with her consent.  Angela Morris 01/09/2023, 4:16 PM

## 2023-01-12 ENCOUNTER — Encounter: Payer: Self-pay | Admitting: Family Medicine

## 2023-01-12 ENCOUNTER — Ambulatory Visit: Payer: 59 | Admitting: Family Medicine

## 2023-01-12 VITALS — BP 150/104 | HR 91 | Temp 98.1°F | Ht 64.0 in | Wt 158.5 lb

## 2023-01-12 DIAGNOSIS — I10 Essential (primary) hypertension: Secondary | ICD-10-CM

## 2023-01-12 MED ORDER — SPIRONOLACTONE 50 MG PO TABS: 50.0000 mg | ORAL_TABLET | Freq: Every day | ORAL | 1 refills | Status: DC

## 2023-01-12 NOTE — Patient Instructions (Addendum)
Follow up on 1/3 or 1/6 to recheck blood pressure INCREASE the Spironolactone to 50mg  daily- 2 of what you have at home and 1 of the new prescription Drink LOTS of water Try and limit your salt intake CONTINUE the Amlodipine Call with any questions or concerns Stay Safe!  Stay Healthy! Happy Holidays!!!

## 2023-01-12 NOTE — Progress Notes (Signed)
   Subjective:    Patient ID: Angela Morris, female    DOB: 15-Feb-1970, 52 y.o.   MRN: [id number]  HPI ER f/u- pt went to ER on 12/9 after her BP was elevated at Plastic Surgery (160-190).  Thankfully she was asymptomatic at that time.  Exam, EKG, and labs were unremarkable.  She takes Spironolactone 25mg  daily and was started on Amlodipine 5mg  daily by the ER.  Pt has been monitoring BP at home and it has been ranging 140-150s/97.  Pt has been under considerable stress at home and work.   Review of Systems For ROS see HPI     Objective:   Physical Exam Vitals reviewed.  Constitutional:      General: She is not in acute distress.    Appearance: Normal appearance. She is well-developed. She is not ill-appearing.  HENT:     Head: Normocephalic and atraumatic.  Eyes:     Conjunctiva/sclera: Conjunctivae normal.     Pupils: Pupils are equal, round, and reactive to light.  Neck:     Thyroid: No thyromegaly.  Cardiovascular:     Rate and Rhythm: Normal rate and regular rhythm.     Heart sounds: Normal heart sounds. No murmur heard. Pulmonary:     Effort: Pulmonary effort is normal. No respiratory distress.     Breath sounds: Normal breath sounds.  Abdominal:     General: There is no distension.     Palpations: Abdomen is soft.     Tenderness: There is no abdominal tenderness.  Musculoskeletal:     Cervical back: Normal range of motion and neck supple.  Lymphadenopathy:     Cervical: No cervical adenopathy.  Skin:    General: Skin is warm and dry.  Neurological:     Mental Status: She is alert and oriented to person, place, and time.  Psychiatric:        Behavior: Behavior normal.           Assessment & Plan:

## 2023-01-14 NOTE — Assessment & Plan Note (Signed)
Deteriorated.  Pt's BP was elevated at Plastic Surgery and she was sent to the ER where her exam, EKG, and labs were unremarkable.  They added Amlodipine 5mg  daily to her dose of Spironolactone.  She has been monitoring home BP's and they have been running 140-150s/90s.  Will increase Spironolactone to 50mg  daily and monitor closely for improvement.  Pt expressed understanding and is in agreement w/ plan.

## 2023-01-26 NOTE — Telephone Encounter (Signed)
error 

## 2023-01-27 ENCOUNTER — Other Ambulatory Visit: Payer: Self-pay | Admitting: Family Medicine

## 2023-01-27 MED ORDER — LORAZEPAM 0.5 MG PO TABS: ORAL_TABLET | ORAL | 0 refills | Status: DC

## 2023-01-27 NOTE — Telephone Encounter (Signed)
Requested Prescriptions   Pending Prescriptions Disp Refills   LORazepam (ATIVAN) 0.5 MG tablet 30 tablet 1    Sig: TAKE HALF TO ONE TABLET BY MOUTH AS NEEDED FOR ANXIETY     Date of patient request: 01/27/2023 Last office visit: 01/12/2023 Upcoming visit: 02/03/2023 Date of last refill: 03/04/2021 Last refill amount: 30

## 2023-01-27 NOTE — Telephone Encounter (Signed)
Refill request received as Dr. Beverely Low out of office.  Controlled substance database reviewed.  Lorazepam No. 30 last filled 05/26/2022, previously 08/14/2021.  No concerns.  Recent office visit in December and physical in October.  Refill granted.

## 2023-01-27 NOTE — Telephone Encounter (Signed)
Copied from CRM [id number]. Topic: Clinical - Medication Refill >> Jan 27, 2023 10:07 AM Marica Otter wrote: Most Recent Primary Care Visit:  Provider: Sheliah Hatch  Department: [facility]  Visit Type: HOSPITAL FU  Date: 01/12/2023  Medication: LORazepam (ATIVAN) 0.5 MG tablet  Has the patient contacted their pharmacy? Yes, prescription expired (Agent: If no, request that the patient contact the pharmacy for the refill. If patient does not wish to contact the pharmacy document the reason why and proceed with request.) (Agent: If yes, when and what did the pharmacy advise?)  Is this the correct pharmacy for this prescription? Yes If no, delete pharmacy and type the correct one.  This is the patient's preferred pharmacy:  [id number] - [place], [place] - [address] [place] [address] Phone: [phone] Fax: [phone]    Has the prescription been filled recently? No  Is the patient out of the medication? Yes  Has the patient been seen for an appointment in the last year OR does the patient have an upcoming appointment?   Can we respond through MyChart?   Agent: Please be advised that Rx refills may take up to 3 business days. We ask that you follow-up with your pharmacy.

## 2023-01-27 NOTE — Telephone Encounter (Signed)
Copied from CRM [id number]. Topic: Clinical - Prescription Issue >> Jan 27, 2023 10:33 AM Gurney Maxin H wrote: Reason for CRM: Patient called in to refill prescription for LORazepam (ATIVAN) 0.5 MG tablet on previous CRM [id number] that automatically resolved EPIC glitch, please assist patient. Thank you

## 2023-01-28 ENCOUNTER — Other Ambulatory Visit: Payer: Self-pay | Admitting: Family Medicine

## 2023-02-03 ENCOUNTER — Ambulatory Visit: Payer: 59 | Admitting: Family Medicine

## 2023-02-03 ENCOUNTER — Encounter: Payer: Self-pay | Admitting: Family Medicine

## 2023-02-03 VITALS — BP 122/90 | HR 100 | Temp 98.0°F | Ht 64.0 in | Wt 159.2 lb

## 2023-02-03 DIAGNOSIS — I10 Essential (primary) hypertension: Secondary | ICD-10-CM

## 2023-02-03 LAB — BASIC METABOLIC PANEL
BUN: 12 mg/dL (ref 6–23)
CO2: 25 meq/L (ref 19–32)
Calcium: 9.9 mg/dL (ref 8.4–10.5)
Chloride: 100 meq/L (ref 96–112)
Creatinine, Ser: 0.67 mg/dL (ref 0.40–1.20)
GFR: 100.3 mL/min (ref >60.00)
Glucose, Bld: 84 mg/dL (ref 70–99)
Potassium: 4.3 meq/L (ref 3.5–5.1)
Sodium: 137 meq/L (ref 135–145)

## 2023-02-03 NOTE — Assessment & Plan Note (Signed)
 Ongoing issue for pt.  Much better today.  Given increased dose of spironolactone will check BMP.  Encouraged increased fluids and limited salt.  Will follow.

## 2023-02-03 NOTE — Progress Notes (Signed)
   Subjective:    Patient ID: Angela Morris, female    DOB: [birth year], 54 y.o.   MRN: [id number]  HPI HTN- ongoing issue.  At last visit we increased Spironolactone  to 50mg  daily in addition to the Amlodipine  5mg  that was started in ER.  Pt reports feeling better since BP has come down.  Energy is improving.  No CP, SOB, HA's, visual changes, edema.  Plans to resume cycle classes.   Review of Systems For ROS see HPI     Objective:   Physical Exam Vitals reviewed.  Constitutional:      General: She is not in acute distress.    Appearance: Normal appearance. She is well-developed. She is not ill-appearing.  HENT:     Head: Normocephalic and atraumatic.  Eyes:     Conjunctiva/sclera: Conjunctivae normal.     Pupils: Pupils are equal, round, and reactive to light.  Neck:     Thyroid : No thyromegaly.  Cardiovascular:     Rate and Rhythm: Normal rate and regular rhythm.     Pulses: Normal pulses.     Heart sounds: Normal heart sounds. No murmur heard. Pulmonary:     Effort: Pulmonary effort is normal. No respiratory distress.     Breath sounds: Normal breath sounds.  Abdominal:     General: There is no distension.     Palpations: Abdomen is soft.     Tenderness: There is no abdominal tenderness.  Musculoskeletal:     Cervical back: Normal range of motion and neck supple.     Right lower leg: No edema.     Left lower leg: No edema.  Lymphadenopathy:     Cervical: No cervical adenopathy.  Skin:    General: Skin is warm and dry.  Neurological:     General: No focal deficit present.     Mental Status: She is alert and oriented to person, place, and time.  Psychiatric:        Behavior: Behavior normal.           Assessment & Plan:

## 2023-02-03 NOTE — Patient Instructions (Signed)
 Follow up in 3 months to recheck BP We'll notify you of your lab results and make any changes if needed No med changes at this time Increase your water intake, limit your salt intake Call with any questions or concerns Stay Safe!  Stay Healthy! Happy New Year!!

## 2023-02-06 ENCOUNTER — Telehealth: Payer: Self-pay

## 2023-02-06 NOTE — Telephone Encounter (Signed)
 Pt has reviewed via MyChart

## 2023-02-06 NOTE — Telephone Encounter (Signed)
-----   Message from Neena Rhymes sent at 02/06/2023  9:43 AM EST ----- Labs look great!  No changes at this time

## 2023-02-09 ENCOUNTER — Institutional Professional Consult (permissible substitution): Payer: 59 | Admitting: Plastic Surgery

## 2023-02-27 ENCOUNTER — Ambulatory Visit (INDEPENDENT_AMBULATORY_CARE_PROVIDER_SITE_OTHER): Payer: Self-pay | Admitting: Plastic Surgery

## 2023-02-27 DIAGNOSIS — Q178 Other specified congenital malformations of ear: Secondary | ICD-10-CM

## 2023-02-27 NOTE — Progress Notes (Signed)
Procedure Note  Preoperative Dx: Acquired defect (split x 2) left earlobe  Postoperative Dx: Same  Procedure: Repair of 2 defects left earlobe  Anesthesia: Lidocaine 1% with 1:100,000 epinephrine and 0.25% Sensorcaine   Indication for Procedure: Improve the appearance of the earlobe  Description of Procedure: Risks and complications were explained to the patient including recurrence.  Consent was confirmed and the patient understands the risks and benefits.  The potential complications and alternatives were explained and the patient consents.  The patient expressed understanding the option of not having the procedure and the risks of a scar.  Time out was called and all information was confirmed to be correct.    The area was prepped and drapped.  Local anesthetic was injected in the subcutaneous tissues.  After waiting for the local to take affect the first defect was addressed.  This defect was approximately 1 cm in length.  The epithelialized surfaces of the defect were excised in a single 5-0 Monocryl suture was placed in the deep tissues.  The skin edges were approximated with interrupted 5-0 Prolene sutures.  Tension was turned to the second defect which was approximately 5 mm in size.  Again the epithelialized surfaces were excised and the deep tissues approximated with a 5-0 Monocryl suture and the skin edges approximated with a 5-0 Prolene suture.    A dressing was applied.  The patient was given instructions on how to care for the area and a follow up appointment.  Angela Morris tolerated the procedure well and there were no complications. No specimen was sent to pathology.

## 2023-03-02 ENCOUNTER — Telehealth: Payer: Self-pay | Admitting: Family Medicine

## 2023-03-02 MED ORDER — ZEPBOUND 7.5 MG/0.5ML ~~LOC~~ SOAJ: 7.5000 mg | SUBCUTANEOUS | 1 refills | Status: DC

## 2023-03-02 NOTE — Telephone Encounter (Signed)
Refill sent to pharmacy.  Hopefully prior auth will be directed to the prior auth team for completion

## 2023-03-02 NOTE — Telephone Encounter (Signed)
Patient is requesting another pa for zepbound

## 2023-03-02 NOTE — Telephone Encounter (Signed)
This was not discussed at most recent visit, please advise if we can send med and attempt PA or if pt needs to come in for a weight management visit to start this process again

## 2023-03-03 ENCOUNTER — Other Ambulatory Visit (HOSPITAL_COMMUNITY): Payer: Self-pay

## 2023-03-03 ENCOUNTER — Telehealth: Payer: Self-pay

## 2023-03-03 NOTE — Telephone Encounter (Signed)
Pharmacy Patient Advocate Encounter   Received notification from Pt Calls Messages that prior authorization for Zepbound 7.5MG /0.5ML pen-injectors is required/requested.   Insurance verification completed.   The patient is insured through [employer] [facility] .   Per test claim: PA required and submitted KEY/EOC/Request #: [id number] APPROVED from 03/03/23 to 03/02/24. Unable to obtain price due to refill too soon rejection, last fill date 02/22/23 next available fill date2/13/25

## 2023-03-03 NOTE — Telephone Encounter (Signed)
Called and informed the patient no questions

## 2023-03-03 NOTE — Telephone Encounter (Signed)
This patient will likely need a PA for Zepbound, Rx has been sent please let us know if you need anything additional

## 2023-03-03 NOTE — Telephone Encounter (Signed)
PA request has been Approved. New Encounter created for follow up. For additional info see Pharmacy Prior Auth telephone encounter from 03/03/23.

## 2023-03-03 NOTE — Telephone Encounter (Signed)
 Patient has been informed.

## 2023-03-07 ENCOUNTER — Ambulatory Visit: Payer: Self-pay | Admitting: Surgical

## 2023-03-07 VITALS — BP 134/88 | HR 75

## 2023-03-07 DIAGNOSIS — Q178 Other specified congenital malformations of ear: Secondary | ICD-10-CM

## 2023-03-07 NOTE — Progress Notes (Signed)
 Patient is a very pleasant 53 year old female here for follow-up after repair of 2 defects of left earlobe/split earlobe.  She had this repaired on 02/27/2023 with Dr. Waddell.  She is 8 days postop.  She reports she is overall doing well, she is not having any issues.  She does have some Prolene sutures in place that need to be removed.  On exam left earlobe incisions are intact and appear to be healing well.  There is some scabbing noted.  There is no erythema or cellulitic changes noted.  A/P:  Prolene sutures were removed, patient tolerated this well.  Recommend thin amount of Vaseline daily for the next few days to help with the scabbing.  Discussed repiercing the earlobe in approximately 6 months. Recommend calling with questions or concerns.

## 2023-04-12 ENCOUNTER — Other Ambulatory Visit: Payer: Self-pay | Admitting: Family Medicine

## 2023-04-12 NOTE — Telephone Encounter (Signed)
 Copied from CRM [id number]. Topic: Clinical - Medication Refill >> Apr 12, 2023  9:36 AM Florestine Avers wrote: Most Recent Primary Care Visit:  Provider: Sheliah Hatch  Department: [facility]  Visit Type: OFFICE VISIT  Date: 02/03/2023  Medication: Zyrtec  Has the patient contacted their pharmacy? No (Agent: If no, request that the patient contact the pharmacy for the refill. If patient does not wish to contact the pharmacy document the reason why and proceed with request.) (Agent: If yes, when and what did the pharmacy advise?)  Is this the correct pharmacy for this prescription? Yes If no, delete pharmacy and type the correct one.  This is the patient's preferred pharmacy:  [id number] - [place], [place] - [address] [place] [address] Phone: [phone] Fax: [phone]  [facility] [id number] - [place], [place] - [address] [place] [address] Phone: [phone] Fax: [phone]   Has the prescription been filled recently? No  Is the patient out of the medication? Yes  Has the patient been seen for an appointment in the last year OR does the patient have an upcoming appointment? Yes  Can we respond through MyChart? Yes  Agent: Please be advised that Rx refills may take up to 3 business days. We ask that you follow-up with your pharmacy.

## 2023-04-12 NOTE — Telephone Encounter (Signed)
 This was previously prescribed by another provider. Do we have permission to signs this under your name?

## 2023-04-13 MED ORDER — CETIRIZINE HCL 5 MG PO TABS: 5.0000 mg | ORAL_TABLET | Freq: Every day | ORAL | 1 refills | Status: DC

## 2023-04-21 ENCOUNTER — Telehealth: Payer: Self-pay

## 2023-04-21 NOTE — Telephone Encounter (Signed)
 Dosage change for Zepbound from 7.5mg  to 10mg . Please advise

## 2023-04-21 NOTE — Telephone Encounter (Signed)
 Copied from CRM [id number]. Topic: Clinical - Medication Question >> Apr 21, 2023  1:43 PM Jon Gills C wrote: Reason for CRM: Patient called in regarding tirzepatide (ZEPBOUND) 7.5 MG/0.5ML Pen would like to up the dosage to 10 mg and have that sent to   [id number] - [place], [place] - [address] AT [facility] [address], [place] [address] Phone: [phone]  Fax: [phone]

## 2023-04-25 ENCOUNTER — Telehealth: Payer: Self-pay

## 2023-04-25 DIAGNOSIS — E669 Obesity, unspecified: Secondary | ICD-10-CM

## 2023-04-25 MED ORDER — TIRZEPATIDE-WEIGHT MANAGEMENT 10 MG/0.5ML ~~LOC~~ SOLN: 10.0000 mg | SUBCUTANEOUS | 1 refills | Status: DC

## 2023-04-25 NOTE — Telephone Encounter (Signed)
 Note reviewed in PCPs absence.  I do see that she has an appointment with Dr. Beverely Low on the 31st.  Currently on 7.5 mg, and if she is tolerating that dose can try the 10 mg dose.  Refill ordered, and can discuss tolerance of that dose at follow-up as scheduled.

## 2023-04-25 NOTE — Telephone Encounter (Signed)
 Called and LM to call back or call the pharmacy, Rx has been sent as requested

## 2023-04-25 NOTE — Telephone Encounter (Signed)
 Copied from CRM [id number]. Topic: Clinical - Prescription Issue >> Apr 25, 2023  8:38 AM Elizebeth Brooking wrote: Reason for CRM: Patient called in regarding  tirzepatide (ZEPBOUND) 7.5 MG/0.5ML Pen , wanted the dosage changed fro 7.5 to 10.. Stated the pharmacy still has not received it yet, would like for someone to give her a status update as soon as today, if not she will have to use the 7.5mg  this week instead of the 10mg  she is asking for

## 2023-04-25 NOTE — Telephone Encounter (Signed)
 Patient is requesting increase from 7.5 to 10 mg/0.35mL

## 2023-04-25 NOTE — Addendum Note (Signed)
 Addended by: Neva Seat, [place] R on: 04/25/2023 11:10 AM   Modules accepted: Orders

## 2023-04-27 ENCOUNTER — Other Ambulatory Visit: Payer: Self-pay

## 2023-04-27 NOTE — Telephone Encounter (Signed)
 Ok to increase Zepbound dose to 10mg  weekly and provide a 3 month supply, no refills

## 2023-04-27 NOTE — Telephone Encounter (Signed)
 Dr.Greene has filled this Zepbound 10mg .

## 2023-05-01 ENCOUNTER — Ambulatory Visit: Payer: 59 | Admitting: Family Medicine

## 2023-05-01 ENCOUNTER — Encounter: Payer: Self-pay | Admitting: Family Medicine

## 2023-05-01 VITALS — BP 122/78 | HR 96 | Ht 64.0 in | Wt 160.0 lb

## 2023-05-01 DIAGNOSIS — I10 Essential (primary) hypertension: Secondary | ICD-10-CM | POA: Diagnosis not present

## 2023-05-01 LAB — BASIC METABOLIC PANEL WITH GFR
BUN: 18 mg/dL (ref 6–23)
CO2: 27 meq/L (ref 19–32)
Calcium: 10.1 mg/dL (ref 8.4–10.5)
Chloride: 103 meq/L (ref 96–112)
Creatinine, Ser: 0.8 mg/dL (ref 0.40–1.20)
GFR: 84.41 mL/min (ref >60.00)
Glucose, Bld: 85 mg/dL (ref 70–99)
Potassium: 4.3 meq/L (ref 3.5–5.1)
Sodium: 140 meq/L (ref 135–145)

## 2023-05-01 MED ORDER — LORAZEPAM 0.5 MG PO TABS: ORAL_TABLET | ORAL | 0 refills | Status: DC

## 2023-05-01 NOTE — Progress Notes (Signed)
   Subjective:    Patient ID: Angela Morris, female    DOB: 1970-07-27, 53 y.o.   MRN: [id number]  HPI HTN- chronic problem, on Amlodipine 5mg  daily, Spironolactone 50mg  daily w/ good control.  Pt reports as BP has decreased, she has cut Spironolactone to 25mg  daily.  No CP, SOB, HA's, visual changes, edema.   Review of Systems For ROS see HPI     Objective:   Physical Exam Vitals reviewed.  Constitutional:      General: She is not in acute distress.    Appearance: Normal appearance. She is well-developed. She is not ill-appearing.  HENT:     Head: Normocephalic and atraumatic.  Eyes:     Conjunctiva/sclera: Conjunctivae normal.     Pupils: Pupils are equal, round, and reactive to light.  Neck:     Thyroid: No thyromegaly.  Cardiovascular:     Rate and Rhythm: Normal rate and regular rhythm.     Pulses: Normal pulses.     Heart sounds: Normal heart sounds. No murmur heard. Pulmonary:     Effort: Pulmonary effort is normal. No respiratory distress.     Breath sounds: Normal breath sounds.  Abdominal:     General: There is no distension.     Palpations: Abdomen is soft.     Tenderness: There is no abdominal tenderness.  Musculoskeletal:     Cervical back: Normal range of motion and neck supple.     Right lower leg: No edema.     Left lower leg: No edema.  Lymphadenopathy:     Cervical: No cervical adenopathy.  Skin:    General: Skin is warm and dry.  Neurological:     General: No focal deficit present.     Mental Status: She is alert and oriented to person, place, and time.  Psychiatric:        Mood and Affect: Mood normal.        Behavior: Behavior normal.        Thought Content: Thought content normal.           Assessment & Plan:

## 2023-05-01 NOTE — Patient Instructions (Signed)
 Schedule your complete physical for early October We'll notify you of your lab results and make any changes  if needed CONTINUE the Amlodipine and 1/2 Spironolactone daily Keep up the good work on healthy diet and regular exercise- you look great! Call with any questions or concerns Stay Safe!  Stay Healthy! Happy Spring!!

## 2023-05-01 NOTE — Assessment & Plan Note (Signed)
 Chronic problem.  Currently well controlled on Amlodipine 5mg  and Spironolactone 25mg  daily.  Check labs due to Spironolactone use but no anticipated med changes.  Will follow.

## 2023-05-19 ENCOUNTER — Other Ambulatory Visit: Payer: Self-pay | Admitting: Family Medicine

## 2023-06-27 ENCOUNTER — Telehealth: Payer: Self-pay

## 2023-06-27 ENCOUNTER — Other Ambulatory Visit: Payer: Self-pay

## 2023-06-27 MED ORDER — ZEPBOUND 10 MG/0.5ML ~~LOC~~ SOAJ: 10.0000 mg | SUBCUTANEOUS | 0 refills | Status: DC

## 2023-06-27 NOTE — Telephone Encounter (Signed)
 Rx refill has been sent, called patient and informed her of the refill

## 2023-06-27 NOTE — Telephone Encounter (Signed)
 Copied from CRM [id number]. Topic: Clinical - Prescription Issue >> Jun 27, 2023  8:56 AM Yolande Hench C wrote: Reason for CRM: Patient has been told by her pharmacy that the PCP is denying the refill for tirzepatide  (ZEPBOUND ) 10 MG/0.5ML Pen; no denial from PCP for this prescription showing in patients chart; please follow up with patients pharmacy to release refill  [facility] [id number] - [place], [place] - [address], [place] [address] Phone: [phone]  Fax: [phone]   Please follow up with patient with an update [phone]

## 2023-06-29 ENCOUNTER — Other Ambulatory Visit: Payer: Self-pay

## 2023-06-29 MED ORDER — VITAMIN D (ERGOCALCIFEROL) 1.25 MG (50000 UNIT) PO CAPS: 50000.0000 [IU] | ORAL_CAPSULE | ORAL | 12 refills | Status: DC

## 2023-07-13 ENCOUNTER — Telehealth: Payer: Self-pay

## 2023-07-13 ENCOUNTER — Other Ambulatory Visit (HOSPITAL_COMMUNITY): Payer: Self-pay

## 2023-07-13 MED ORDER — ZEPBOUND 10 MG/0.5ML ~~LOC~~ SOAJ: 10.0000 mg | SUBCUTANEOUS | 0 refills | Status: DC

## 2023-07-13 NOTE — Telephone Encounter (Signed)
 Called patient and informed her that there was a refill sent on the 27th pt notes pharmacy has not called her to let her know she is going to check with them now

## 2023-07-13 NOTE — Addendum Note (Signed)
 Addended by: Brae Gartman K on: 07/13/2023 01:33 PM   Modules accepted: Orders

## 2023-07-13 NOTE — Telephone Encounter (Signed)
 Pharmacy Patient Advocate Encounter   Received notification from Pt Calls Messages that prior authorization for ZEPBOUND  10MG /0.5ML is required/requested.   Insurance verification completed.   The patient is insured through [facility] ADVANTAGE/RX ADVANCE .   Per test claim: Refill too soon. PA is not needed at this time. Medication was filled 06/27/23. Next eligible fill date is 07/19/23.

## 2023-07-13 NOTE — Telephone Encounter (Signed)
 Copied from CRM [id number]. Topic: Clinical - Medication Question >> Jul 13, 2023 12:14 PM Aisha D wrote: Reason for CRM: Pt is calling to get the medication refill for the tirzepatide  (ZEPBOUND ) 10 MG/0.5ML Pen. Pt stated that the pharmacy stated they have been requesting approval for the medication but hasn't received a response.

## 2023-07-13 NOTE — Telephone Encounter (Signed)
 Prescription sent for Tirzepatide  pen

## 2023-07-13 NOTE — Telephone Encounter (Signed)
 Patient would like a call back

## 2023-07-13 NOTE — Telephone Encounter (Signed)
 Medication needs PA can you please submit this when you have the chance thank you!

## 2023-07-13 NOTE — Telephone Encounter (Signed)
 Please let pt know the pharmacist's response of too soon to fill

## 2023-07-14 ENCOUNTER — Telehealth: Payer: Self-pay

## 2023-07-14 NOTE — Telephone Encounter (Signed)
 Copied from CRM [id number]. Topic: Clinical - Medication Question >> Jul 13, 2023  4:50 PM Shereese L wrote: Reason for CRM: patient is stating that she's going out of town for 2 weeks and that's why she requesting  Refill too soon.earlier adv her Medication was filled 06/27/23. Next eligible fill date is 07/19/23.   She stated that she will have her medication sent temporarily to [facility] [id number] Astrid Blamer, [place] - [address] [phone] [address] [place] [address] She stated that she will call back Monday to make sure the it get sent to the correct pharmacy

## 2023-07-14 NOTE — Telephone Encounter (Signed)
 Patient states she is requesting the refill too soon due to going out of town for 2 weeks. Patient would like to have the Tirzepatide  pen sent to pharmacy below in notes.

## 2023-07-25 MED ORDER — ZEPBOUND 10 MG/0.5ML ~~LOC~~ SOAJ: 10.0000 mg | SUBCUTANEOUS | 0 refills | Status: DC

## 2023-07-25 NOTE — Telephone Encounter (Signed)
 Prescription sent to pharmacy in [place]  as requested

## 2023-07-25 NOTE — Telephone Encounter (Signed)
 Called and informed patient.

## 2023-08-01 ENCOUNTER — Other Ambulatory Visit: Payer: Self-pay | Admitting: Family Medicine

## 2023-08-01 DIAGNOSIS — Z1231 Encounter for screening mammogram for malignant neoplasm of breast: Secondary | ICD-10-CM

## 2023-08-03 ENCOUNTER — Ambulatory Visit
Admission: RE | Admit: 2023-08-03 | Discharge: 2023-08-03 | Disposition: A | Discharge status: Home / Self Care | Source: Ambulatory Visit | Attending: Family Medicine | Admitting: Family Medicine

## 2023-08-03 DIAGNOSIS — Z1231 Encounter for screening mammogram for malignant neoplasm of breast: Secondary | ICD-10-CM

## 2023-08-21 ENCOUNTER — Telehealth: Payer: Self-pay

## 2023-08-21 ENCOUNTER — Other Ambulatory Visit (HOSPITAL_COMMUNITY): Payer: Self-pay

## 2023-08-21 ENCOUNTER — Other Ambulatory Visit: Payer: Self-pay

## 2023-08-21 MED ORDER — WEGOVY 1.7 MG/0.75ML ~~LOC~~ SOAJ: 1.7000 mg | SUBCUTANEOUS | 2 refills | Status: DC

## 2023-08-21 MED ORDER — ZEPBOUND 10 MG/0.5ML ~~LOC~~ SOAJ: 10.0000 mg | SUBCUTANEOUS | 0 refills | Status: DC

## 2023-08-21 NOTE — Telephone Encounter (Signed)
 Patients insurance prefers Wegovy  over Zepbound . Please see message below. Thank you

## 2023-08-21 NOTE — Telephone Encounter (Signed)
 Pharmacy Patient Advocate Encounter   Received notification from CoverMyMeds that prior authorization for Zepbound  10 is required/requested.   Insurance verification completed.   The patient is insured through [facility] .   Per test claim:  Wegovy  is preferred by the insurance.  If suggested medication is appropriate, Please send in a new RX and discontinue this one. If not, please advise as to why it's not appropriate so that we may request a Prior Authorization. Please note, some preferred medications may still require a PA.  If the suggested medications have not been trialed and there are no contraindications to their use, the PA will not be submitted, as it will not be approved.  Ran test claim for Wegovy  2.4 and patient has active PA that expires 03/02/24. Patient co-pay is $24.99.

## 2023-08-21 NOTE — Addendum Note (Signed)
 Addended by: JERRELL SOLIAN T on: 08/21/2023 10:27 AM   Modules accepted: Orders

## 2023-08-21 NOTE — Telephone Encounter (Signed)
 I spoke with the patient by phone.  We decided to switch from Zepbound  to Wegovy  because of the insurance access.  She was tolerating 10 mg weekly well.  I am going to switch her to Wegovy  1.7 mg weekly.  This is a small dose reduction, trying to minimize side effects of nausea.  Can increase the dose after 4 weeks if needed.

## 2023-08-22 NOTE — Telephone Encounter (Signed)
 Patient would rather be on Mounjaro  instead of Wegovy ? She is wondering if they could be switched?    Copied from CRM [id number]. Topic: Clinical - Medication Question >> Aug 22, 2023 10:37 AM Angela Morris wrote: Reason for CRM: Patient would like call back regarding Mounjaro  and the [facility] - she has questions regarding coverage and PA. Please call her at [phone] (M)

## 2023-08-22 NOTE — Telephone Encounter (Signed)
 Patient notes she is happy to remain Wegovy  this has been filled and covered no need to change to Mounjaro 

## 2023-08-22 NOTE — Telephone Encounter (Signed)
 Mounjaro  is the same medicine as Zepbound , which we just switched away from. Mounjaro  is the brand name that is approved for treatment of diabetes, which she does not have I think. I am happy to prescribe either Zepbound  or Wegovy , but I recommend she talk with her insurance first to see what they will cover.

## 2023-10-20 ENCOUNTER — Other Ambulatory Visit: Payer: Self-pay | Admitting: Family Medicine

## 2023-10-20 MED ORDER — SPIRONOLACTONE 50 MG PO TABS: 50.0000 mg | ORAL_TABLET | Freq: Every day | ORAL | 1 refills | Status: AC

## 2023-10-20 MED ORDER — AMLODIPINE BESYLATE 5 MG PO TABS: 5.0000 mg | ORAL_TABLET | Freq: Every day | ORAL | 1 refills | Status: DC

## 2023-10-20 NOTE — Telephone Encounter (Signed)
 Copied from CRM [id number]. Topic: Clinical - Medication Refill >> Oct 20, 2023 10:43 AM Pinkey ORN wrote: Medication: amLODipine  (NORVASC ) 5 MG tablet, spironolactone  (ALDACTONE ) 50 MG tablet  Has the patient contacted their pharmacy? Yes (Agent: If no, request that the patient contact the pharmacy for the refill. If patient does not wish to contact the pharmacy document the reason why and proceed with request.) (Agent: If yes, when and what did the pharmacy advise?)  This is the patient's preferred pharmacy:   [facility] #[id number] - [place], [place] - [address] [place] [address] Phone: [phone] Fax: [phone]   Is this the correct pharmacy for this prescription? Yes If no, delete pharmacy and type the correct one.   Has the prescription been filled recently? No  Is the patient out of the medication? No  Has the patient been seen for an appointment in the last year OR does the patient have an upcoming appointment? Yes  Can we respond through MyChart? Yes  Agent: Please be advised that Rx refills may take up to 3 business days. We ask that you follow-up with your pharmacy. >> Oct 20, 2023 10:45 AM Pinkey ORN wrote: Patient is leaving out of country on Monday and is wanting to have this medication available for pickup today, if possible.

## 2023-11-08 ENCOUNTER — Ambulatory Visit (INDEPENDENT_AMBULATORY_CARE_PROVIDER_SITE_OTHER): Admitting: Family Medicine

## 2023-11-08 ENCOUNTER — Encounter: Payer: Self-pay | Admitting: Family Medicine

## 2023-11-08 VITALS — BP 130/78 | HR 87 | Temp 98.6°F | Ht 64.0 in | Wt 166.0 lb

## 2023-11-08 DIAGNOSIS — Z0000 Encounter for general adult medical examination without abnormal findings: Secondary | ICD-10-CM

## 2023-11-08 DIAGNOSIS — Z114 Encounter for screening for human immunodeficiency virus [HIV]: Secondary | ICD-10-CM

## 2023-11-08 DIAGNOSIS — I10 Essential (primary) hypertension: Secondary | ICD-10-CM

## 2023-11-08 DIAGNOSIS — E559 Vitamin D deficiency, unspecified: Secondary | ICD-10-CM

## 2023-11-08 LAB — BASIC METABOLIC PANEL WITH GFR
BUN: 14 mg/dL (ref 6–23)
CO2: 29 meq/L (ref 19–32)
Calcium: 9.6 mg/dL (ref 8.4–10.5)
Chloride: 104 meq/L (ref 96–112)
Creatinine, Ser: 0.59 mg/dL (ref 0.40–1.20)
GFR: 102.87 mL/min (ref >60.00)
Glucose, Bld: 89 mg/dL (ref 70–99)
Potassium: 4.2 meq/L (ref 3.5–5.1)
Sodium: 140 meq/L (ref 135–145)

## 2023-11-08 LAB — LIPID PANEL
Cholesterol: 227 mg/dL — ABNORMAL HIGH (ref 0–200)
HDL: 93.6 mg/dL (ref >39.00)
LDL Cholesterol: 121 mg/dL — ABNORMAL HIGH (ref 0–99)
NonHDL: 133.12
Total CHOL/HDL Ratio: 2
Triglycerides: 63 mg/dL (ref 0.0–149.0)
VLDL: 12.6 mg/dL (ref 0.0–40.0)

## 2023-11-08 LAB — HEPATIC FUNCTION PANEL
ALT: 14 U/L (ref 0–35)
AST: 18 U/L (ref 0–37)
Albumin: 4.5 g/dL (ref 3.5–5.2)
Alkaline Phosphatase: 61 U/L (ref 39–117)
Bilirubin, Direct: 0.1 mg/dL (ref 0.0–0.3)
Total Bilirubin: 0.6 mg/dL (ref 0.2–1.2)
Total Protein: 7.5 g/dL (ref 6.0–8.3)

## 2023-11-08 LAB — CBC WITH DIFFERENTIAL/PLATELET
Basophils Absolute: 0.1 K/uL (ref 0.0–0.1)
Basophils Relative: 1.3 % (ref 0.0–3.0)
Eosinophils Absolute: 0.2 K/uL (ref 0.0–0.7)
Eosinophils Relative: 5.6 % — ABNORMAL HIGH (ref 0.0–5.0)
HCT: 38.8 % (ref 36.0–46.0)
Hemoglobin: 12.3 g/dL (ref 12.0–15.0)
Lymphocytes Relative: 31.4 % (ref 12.0–46.0)
Lymphs Abs: 1.3 K/uL (ref 0.7–4.0)
MCHC: 31.7 g/dL (ref 30.0–36.0)
MCV: 84.1 fl (ref 78.0–100.0)
Monocytes Absolute: 0.4 K/uL (ref 0.1–1.0)
Monocytes Relative: 10.6 % (ref 3.0–12.0)
Neutro Abs: 2.2 K/uL (ref 1.4–7.7)
Neutrophils Relative %: 51.1 % (ref 43.0–77.0)
Platelets: 309 K/uL (ref 150.0–400.0)
RBC: 4.61 Mil/uL (ref 3.87–5.11)
RDW: 14.8 % (ref 11.5–15.5)
WBC: 4.2 K/uL (ref 4.0–10.5)

## 2023-11-08 LAB — VITAMIN D 25 HYDROXY (VIT D DEFICIENCY, FRACTURES): VITD: 21.17 ng/mL — ABNORMAL LOW (ref 30.00–100.00)

## 2023-11-08 LAB — TSH: TSH: 2.34 u[IU]/mL (ref 0.35–5.50)

## 2023-11-08 MED ORDER — CETIRIZINE HCL 5 MG PO TABS: 5.0000 mg | ORAL_TABLET | Freq: Every day | ORAL | 1 refills | Status: AC

## 2023-11-08 MED ORDER — LORAZEPAM 0.5 MG PO TABS: ORAL_TABLET | ORAL | 0 refills | Status: AC

## 2023-11-08 MED ORDER — WEGOVY 1.7 MG/0.75ML ~~LOC~~ SOAJ: 1.7000 mg | SUBCUTANEOUS | 2 refills | Status: DC

## 2023-11-08 NOTE — Progress Notes (Signed)
   Subjective:    Patient ID: Angela Morris, female    DOB: Feb 17, 1970, 53 y.o.   MRN: [id number]  HPI CPE- UTD on pap, mammo, cologuard, Tdap.  Patient Care Team    Relationship Specialty Notifications Start End  Mahlon Comer BRAVO, MD PCP - General   01/13/10   Raeanne Shanda SQUIBB, MD (Inactive) Consulting Physician Obstetrics and Gynecology  11/29/16     Health Maintenance  Topic Date Due   HIV Screening  Never done   Hepatitis B Vaccines 19-59 Average Risk (1 of 3 - 19+ 3-dose series) Never done   Pneumococcal Vaccine: 50+ Years (1 of 1 - PCV) Never done   Zoster Vaccines- Shingrix (1 of 2) Never done   COVID-19 Vaccine (1 - 2024-25 season) Never done   Cervical Cancer Screening (HPV/Pap Cotest)  11/28/2023   Influenza Vaccine  04/30/2024 (Originally 09/01/2023)   DTaP/Tdap/Td (3 - Td or Tdap) 09/03/2024   Fecal DNA (Cologuard)  11/29/2024   Mammogram  08/02/2025   HPV VACCINES  Aged Out   Meningococcal B Vaccine  Aged Out   Hepatitis C Screening  Discontinued      Review of Systems Patient reports no vision/ hearing changes, adenopathy,fever, weight change,  persistant/recurrent hoarseness , swallowing issues, chest pain, palpitations, edema, persistant/recurrent cough, hemoptysis, dyspnea (rest/exertional/paroxysmal nocturnal), gastrointestinal bleeding (melena, rectal bleeding), abdominal pain, significant heartburn, bowel changes, GU symptoms (dysuria, hematuria, incontinence), Gyn symptoms (abnormal  bleeding, pain),  syncope, focal weakness, memory loss, numbness & tingling, skin/hair/nail changes, abnormal bruising or bleeding, anxiety, or depression.     Objective:   Physical Exam General Appearance:    Alert, cooperative, no distress, appears stated age  Head:    Normocephalic, without obvious abnormality, atraumatic  Eyes:    PERRL, conjunctiva/corneas clear, EOM's intact both eyes  Ears:    Normal TM's and external ear canals, both ears  Nose:   Nares  normal, septum midline, mucosa normal, no drainage    or sinus tenderness  Throat:   Lips, mucosa, and tongue normal; teeth and gums normal  Neck:   Supple, symmetrical, trachea midline, no adenopathy;    Thyroid : no enlargement/tenderness/nodules  Back:     Symmetric, no curvature, ROM normal, no CVA tenderness  Lungs:     Clear to auscultation bilaterally, respirations unlabored  Chest Wall:    No tenderness or deformity   Heart:    Regular rate and rhythm, S1 and S2 normal, no murmur, rub   or gallop  Breast Exam:    Deferred to GYN  Abdomen:     Soft, non-tender, bowel sounds active Morris four quadrants,    no masses, no organomegaly  Genitalia:    Deferred to GYN  Rectal:    Extremities:   Extremities normal, atraumatic, no cyanosis or edema  Pulses:   2+ and symmetric Morris extremities  Skin:   Skin color, texture, turgor normal, no rashes or lesions  Lymph nodes:   Cervical, supraclavicular, and axillary nodes normal  Neurologic:   CNII-XII intact, normal strength, sensation and reflexes    throughout          Assessment & Plan:

## 2023-11-08 NOTE — Patient Instructions (Signed)
 Follow up in 6 months to recheck blood pressure We'll notify you of your lab results and make any changes if needed Continue to work on healthy diet and regular exercise- you can do it! Call with any questions or concerns Stay Safe!  Stay Healthy! Happy Fall!!

## 2023-11-09 LAB — HIV ANTIBODY (ROUTINE TESTING W REFLEX)
HIV 1&2 Ab, 4th Generation: NONREACTIVE
HIV FINAL INTERPRETATION: NEGATIVE

## 2023-11-10 ENCOUNTER — Ambulatory Visit: Payer: Self-pay | Admitting: Family Medicine

## 2023-11-13 ENCOUNTER — Other Ambulatory Visit: Payer: Self-pay

## 2023-11-13 MED ORDER — VITAMIN D (ERGOCALCIFEROL) 1.25 MG (50000 UNIT) PO CAPS: 50000.0000 [IU] | ORAL_CAPSULE | ORAL | 0 refills | Status: AC

## 2023-11-13 NOTE — Progress Notes (Signed)
 Vitamin D has been sent in Pt has reviewed labs via MyChart

## 2023-11-19 NOTE — Assessment & Plan Note (Signed)
 Pt's PE WNL.  UTD on pap, mammo, cologuard, Tdap.  Decline flu.  Check labs.  Anticipatory guidance provided.

## 2023-11-30 ENCOUNTER — Other Ambulatory Visit: Payer: Self-pay | Admitting: Student in an Organized Health Care Education/Training Program

## 2023-12-28 ENCOUNTER — Other Ambulatory Visit: Payer: Self-pay | Admitting: Family Medicine

## 2024-05-13 ENCOUNTER — Ambulatory Visit: Admitting: Family Medicine
